# Patient Record
Sex: Male | Born: 1995 | Hispanic: No | Marital: Single | Smoking: Former smoker
Health system: Southern US, Community
[De-identification: ages and names within clinical notes are randomized; demographics above are authoritative.]

## PROBLEM LIST (undated history)

## (undated) DIAGNOSIS — J45909 Unspecified asthma, uncomplicated: Secondary | ICD-10-CM

---
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
## 2016-08-08 ENCOUNTER — Encounter (HOSPITAL_COMMUNITY): Payer: Self-pay | Admitting: Emergency Medicine

## 2016-08-08 ENCOUNTER — Emergency Department (HOSPITAL_COMMUNITY)
Admission: EM | Admit: 2016-08-08 | Discharge: 2016-08-08 | Disposition: A | Discharge status: Home / Self Care | Payer: Self-pay | Attending: Emergency Medicine | Admitting: Emergency Medicine

## 2016-08-08 DIAGNOSIS — Z87891 Personal history of nicotine dependence: Secondary | ICD-10-CM | POA: Insufficient documentation

## 2016-08-08 DIAGNOSIS — J4521 Mild intermittent asthma with (acute) exacerbation: Secondary | ICD-10-CM | POA: Insufficient documentation

## 2016-08-08 DIAGNOSIS — J039 Acute tonsillitis, unspecified: Secondary | ICD-10-CM | POA: Insufficient documentation

## 2016-08-08 LAB — MONONUCLEOSIS SCREEN: MONO SCREEN: NEGATIVE

## 2016-08-08 LAB — RAPID STREP SCREEN (MED CTR MEBANE ONLY): STREPTOCOCCUS, GROUP A SCREEN (DIRECT): NEGATIVE

## 2016-08-08 MED ORDER — PREDNISONE 20 MG PO TABS: ORAL_TABLET | ORAL | 0 refills | Status: DC

## 2016-08-08 MED ORDER — ALBUTEROL SULFATE (2.5 MG/3ML) 0.083% IN NEBU
5.0000 mg | INHALATION_SOLUTION | Freq: Once | RESPIRATORY_TRACT | Status: AC
  Administered 2016-08-08: 5 mg via RESPIRATORY_TRACT
  Filled 2016-08-08: qty 6

## 2016-08-08 MED ORDER — PENICILLIN V POTASSIUM 500 MG PO TABS: 500.0000 mg | ORAL_TABLET | Freq: Three times a day (TID) | ORAL | 0 refills | Status: AC

## 2016-08-08 MED ORDER — METHYLPREDNISOLONE SODIUM SUCC 125 MG IJ SOLR
125.0000 mg | Freq: Once | INTRAMUSCULAR | Status: AC
  Administered 2016-08-08: 125 mg via INTRAMUSCULAR
  Filled 2016-08-08: qty 2

## 2016-08-08 MED ORDER — PENICILLIN V POTASSIUM 500 MG PO TABS: 500.0000 mg | ORAL_TABLET | Freq: Three times a day (TID) | ORAL | 0 refills | Status: DC

## 2016-08-08 MED ORDER — ALBUTEROL SULFATE HFA 108 (90 BASE) MCG/ACT IN AERS
1.0000 | INHALATION_SPRAY | RESPIRATORY_TRACT | Status: DC | PRN
  Filled 2016-08-08: qty 6.7

## 2016-08-08 MED ORDER — ALBUTEROL (5 MG/ML) CONTINUOUS INHALATION SOLN
10.0000 mg/h | INHALATION_SOLUTION | RESPIRATORY_TRACT | Status: DC
  Administered 2016-08-08: 10 mg/h via RESPIRATORY_TRACT
  Filled 2016-08-08: qty 20

## 2016-08-08 NOTE — ED Provider Notes (Signed)
[facility]-EMERGENCY DEPT Provider Note   CSN: [id number] Arrival date & time: 08/08/16  1147   By signing my name below, I, Arianna Nassar, attest that this documentation has been prepared under the direction and in the presence of Loren Raceravid Sary Bogie, MD.  Electronically Signed: Octavia HeirArianna Nassar, ED Scribe. 08/08/16. 12:52 PM.   History   Chief Complaint Chief Complaint  Patient presents with  . Asthma    The history is provided by the patient. No language interpreter was used.   HPI Comments: Clelia SchaumannKane M Platter is a 20 y.o. male who has a PMhx of asthma presents to the Emergency Department presenting with a gradual worsening, moderate, asthma exacerbation x 1 month. Pt states having associated sore throat, fatigue, nonproductive cough, and burning sensation in his chest. He reports that everything "feels tight". He notes his symptoms are worse when he wakes up in the morning or lays flat. Pt has been using a nebulizer albuterol treatment 3-4x a day. Pt denies fever or chills.  Past Medical History:  Diagnosis Date  . Asthma     There are no active problems to display for this patient.   History reviewed. No pertinent surgical history.     Home Medications    Prior to Admission medications   Medication Sig Start Date End Date Taking? Authorizing Provider  penicillin v potassium (VEETID) 500 MG tablet Take 1 tablet (500 mg total) by mouth 3 (three) times daily. 08/08/16 08/15/16  Loren Raceravid Madelyn Tlatelpa, MD  predniSONE (DELTASONE) 20 MG tablet 3 tabs po day one, then 2 tabs daily x 4 days 08/08/16   Loren Raceravid Essie Lagunes, MD    Family History History reviewed. No pertinent family history.  Social History Social History  Substance Use Topics  . Smoking status: Former Smoker    Types: Cigarettes, Cigars  . Smokeless tobacco: Not on file  . Alcohol use No     Allergies   Patient has no known allergies.   Review of Systems Review of Systems  Constitutional: Positive for fatigue. Negative  for chills and fever.  HENT: Positive for congestion and sore throat. Negative for facial swelling and trouble swallowing.   Respiratory: Positive for cough, chest tightness, shortness of breath and wheezing.   Cardiovascular: Negative for chest pain, palpitations and leg swelling.  Gastrointestinal: Negative for abdominal pain, diarrhea, nausea and vomiting.  Musculoskeletal: Negative for back pain, myalgias, neck pain and neck stiffness.  Skin: Negative for rash.  Neurological: Negative for weakness, light-headedness and headaches.  All other systems reviewed and are negative.    Physical Exam Updated Vital Signs BP 146/67 (BP Location: Right Arm)   Pulse 118 Comment: albuterol treatment  Temp 98.9 F (37.2 C) (Oral)   Resp 18   SpO2 95%   Physical Exam  Constitutional: He is oriented to person, place, and time. He appears well-developed and well-nourished. No distress.  HENT:  Head: Normocephalic and atraumatic.  Mouth/Throat: Oropharyngeal exudate present.  Bilateral tonsillar hypertrophy with erythema and exudates.  Eyes: EOM are normal. Pupils are equal, round, and reactive to light.  Neck: Normal range of motion. Neck supple.  No meningismus  Cardiovascular: Normal rate and regular rhythm.   Pulmonary/Chest: Effort normal. He has wheezes.  Diffuse expiratory wheezing  Abdominal: Soft. Bowel sounds are normal. There is no tenderness. There is no rebound and no guarding.  Musculoskeletal: Normal range of motion. He exhibits no edema or tenderness.  No lower extremity swelling, asymmetry or tenderness.  Lymphadenopathy:    He has no  cervical adenopathy.  Neurological: He is alert and oriented to person, place, and time.  Moves all extremities without deficit. Sensation intact.  Skin: Skin is warm and dry. Capillary refill takes less than 2 seconds. No rash noted. He is not diaphoretic. No erythema.  Psychiatric: He has a normal mood and affect. His behavior is normal.    Nursing note and vitals reviewed.    ED Treatments / Results  DIAGNOSTIC STUDIES: Oxygen Saturation is 99% on RA, normal by my interpretation.  COORDINATION OF CARE:  12:51 PM Discussed treatment plan with pt at bedside and pt agreed to plan.  Labs (all labs ordered are listed, but only abnormal results are displayed) Labs Reviewed  RAPID STREP SCREEN (NOT AT [facility])  CULTURE, GROUP A STREP [facility])  MONONUCLEOSIS SCREEN    EKG  EKG Interpretation None       Radiology No results found.  Procedures Procedures (including critical care time)  Medications Ordered in ED Medications  methylPREDNISolone sodium succinate (SOLU-MEDROL) 125 mg/2 mL injection 125 mg (125 mg Intramuscular Given 08/08/16 1304)  albuterol (PROVENTIL) (2.5 MG/3ML) 0.083% nebulizer solution 5 mg (5 mg Nebulization Given 08/08/16 1313)     Initial Impression / Assessment and Plan / ED Course  I have reviewed the triage vital signs and the nursing notes.  Pertinent labs & imaging results that were available during my care of the patient were reviewed by me and considered in my medical decision making (see chart for details).  Clinical Course     I personally performed the services described in this documentation, which was scribed in my presence. The recorded information has been reviewed and is accurate.    Final Clinical Impressions(s) / ED Diagnoses   Final diagnoses:  Exacerbation of intermittent asthma, unspecified asthma severity  Tonsillitis with exudate  Patient is moving air much better. No further wheezing. Patient has bilateral tonsillar hypertrophy with exudates. Negative strep and mono.   New Prescriptions Discharge Medication List as of 08/08/2016  4:07 PM       Loren Raceravid Gavriela Cashin, MD 08/13/16 1630

## 2016-08-08 NOTE — ED Triage Notes (Signed)
Pt arrives via POv from home with asthma exacerbation for the last month. Pt reports cough and chest tightness over the last month. Denies recent fever. Pt with wheezing in all fields. VSS. NAD at present.

## 2016-08-10 LAB — CULTURE, GROUP A STREP (THRC)

## 2016-09-06 ENCOUNTER — Encounter (HOSPITAL_COMMUNITY): Payer: Self-pay | Admitting: Emergency Medicine

## 2016-09-06 ENCOUNTER — Emergency Department (HOSPITAL_COMMUNITY)
Admission: EM | Admit: 2016-09-06 | Discharge: 2016-09-06 | Disposition: A | Discharge status: Home / Self Care | Payer: Self-pay | Attending: Emergency Medicine | Admitting: Emergency Medicine

## 2016-09-06 DIAGNOSIS — J45901 Unspecified asthma with (acute) exacerbation: Secondary | ICD-10-CM | POA: Insufficient documentation

## 2016-09-06 DIAGNOSIS — Z87891 Personal history of nicotine dependence: Secondary | ICD-10-CM | POA: Insufficient documentation

## 2016-09-06 MED ORDER — ALBUTEROL SULFATE (2.5 MG/3ML) 0.083% IN NEBU
5.0000 mg | INHALATION_SOLUTION | Freq: Once | RESPIRATORY_TRACT | Status: AC
  Administered 2016-09-06: 5 mg via RESPIRATORY_TRACT

## 2016-09-06 MED ORDER — ALBUTEROL SULFATE (2.5 MG/3ML) 0.083% IN NEBU
INHALATION_SOLUTION | RESPIRATORY_TRACT | Status: AC
  Filled 2016-09-06: qty 6

## 2016-09-06 MED ORDER — ALBUTEROL SULFATE HFA 108 (90 BASE) MCG/ACT IN AERS
2.0000 | INHALATION_SPRAY | Freq: Once | RESPIRATORY_TRACT | Status: AC
  Administered 2016-09-06: 2 via RESPIRATORY_TRACT
  Filled 2016-09-06: qty 6.7

## 2016-09-06 MED ORDER — DEXAMETHASONE SODIUM PHOSPHATE 10 MG/ML IJ SOLN
10.0000 mg | Freq: Once | INTRAMUSCULAR | Status: AC
  Administered 2016-09-06: 10 mg via INTRAMUSCULAR
  Filled 2016-09-06: qty 1

## 2016-09-06 NOTE — ED Provider Notes (Signed)
[facility]-EMERGENCY DEPT Provider Note   CSN: [id number] Arrival date & time: 09/06/16  0134     History   Chief Complaint Chief Complaint  Patient presents with  . Asthma    HPI   Blood pressure 128/75, pulse 82, temperature 97.9 F (36.6 C), temperature source Oral, resp. rate 18, SpO2 97 %.  Joshua Roach is a 21 y.o. male complaining of shortness of breath and wheezing onset yesterday thinks this was likely set off by the cold weather. He has a history of asthma but he ran out of his inhaler one week ago. He has been hospitalized ever intubated for his asthma he denies fever, chills, productive cough, chest pain. Patient has received a nebulizer treatment in the waiting room and states that he feels much better.  Past Medical History:  Diagnosis Date  . Asthma     There are no active problems to display for this patient.   History reviewed. No pertinent surgical history.     Home Medications    Prior to Admission medications   Medication Sig Start Date End Date Taking? Authorizing Provider  predniSONE (DELTASONE) 20 MG tablet 3 tabs po day one, then 2 tabs daily x 4 days 08/08/16   Loren Raceravid Yelverton, MD    Family History No family history on file.  Social History Social History  Substance Use Topics  . Smoking status: Former Smoker    Types: Cigarettes, Cigars  . Smokeless tobacco: Never Used  . Alcohol use No     Allergies   Patient has no known allergies.   Review of Systems Review of Systems  10 systems reviewed and found to be negative, except as noted in the HPI.  Physical Exam Updated Vital Signs BP 128/75 (BP Location: Left Arm)   Pulse 82   Temp 97.9 F (36.6 C) (Oral)   Resp 18   SpO2 97%   Physical Exam  Constitutional: He is oriented to person, place, and time. He appears well-developed and well-nourished. No distress.  HENT:  Head: Normocephalic and atraumatic.  Mouth/Throat: Oropharynx is clear and moist.  Eyes: Conjunctivae and  EOM are normal. Pupils are equal, round, and reactive to light.  Neck: Normal range of motion.  Cardiovascular: Normal rate, regular rhythm and intact distal pulses.   Pulmonary/Chest: Effort normal and breath sounds normal. No respiratory distress. He has no wheezes. He has no rales. He exhibits no tenderness.  No tripoding, no stridor, patient speaking in complete sentences.   No wheezing, Rales or rhonchi.  Good air movement in all fields.   Abdominal: Soft. There is no tenderness.  Musculoskeletal: Normal range of motion.  Neurological: He is alert and oriented to person, place, and time.  Skin: He is not diaphoretic.  Psychiatric: He has a normal mood and affect.  Nursing note and vitals reviewed.    ED Treatments / Results  Labs (all labs ordered are listed, but only abnormal results are displayed) Labs Reviewed - No data to display  EKG  EKG Interpretation None       Radiology No results found.  Procedures Procedures (including critical care time)  Medications Ordered in ED Medications  albuterol (PROVENTIL) (2.5 MG/3ML) 0.083% nebulizer solution (  Canceled Entry 09/06/16 0242)  albuterol (PROVENTIL HFA;VENTOLIN HFA) 108 (90 Base) MCG/ACT inhaler 2 puff (not administered)  dexamethasone (DECADRON) injection 10 mg (not administered)  albuterol (PROVENTIL) (2.5 MG/3ML) 0.083% nebulizer solution 5 mg (5 mg Nebulization Given 09/06/16 0146)     Initial  Impression / Assessment and Plan / ED Course  I have reviewed the triage vital signs and the nursing notes.  Pertinent labs & imaging results that were available during my care of the patient were reviewed by me and considered in my medical decision making (see chart for details).  Clinical Course     Vitals:   09/06/16 0140 09/06/16 0409 09/06/16 0652  BP: 141/85 128/61 128/75  Pulse: 86 73 82  Resp: 19 19 18   Temp: 97.7 F (36.5 C) 97.7 F (36.5 C) 97.9 F (36.6 C)  TempSrc: Oral Oral Oral  SpO2: 99%  95% 97%    Medications  albuterol (PROVENTIL) (2.5 MG/3ML) 0.083% nebulizer solution (  Canceled Entry 09/06/16 0242)  albuterol (PROVENTIL HFA;VENTOLIN HFA) 108 (90 Base) MCG/ACT inhaler 2 puff (not administered)  dexamethasone (DECADRON) injection 10 mg (not administered)  albuterol (PROVENTIL) (2.5 MG/3ML) 0.083% nebulizer solution 5 mg (5 mg Nebulization Given 09/06/16 0146)    Joshua Roach is 21 y.o. male presenting with Asthma exacerbation, he ran out of his inhaler one week ago. Patient's lung sounds are clear with no wheezing and excellent air movement in all fields. Offered patient a second nebulizer and she declines. Lung sounds clear, saturating well on room air, no tachypnea or tachycardia, I doubt this is a pneumonia. Patient is given Decadron IM and an inhaler to go home with, resource guide provided.  Evaluation does not show pathology that would require ongoing emergent intervention or inpatient treatment. Pt is hemodynamically stable and mentating appropriately. Discussed findings and plan with patient/guardian, who agrees with care plan. All questions answered. Return precautions discussed and outpatient follow up given.      Final Clinical Impressions(s) / ED Diagnoses   Final diagnoses:  Exacerbation of asthma, unspecified asthma severity, unspecified whether persistent    New Prescriptions New Prescriptions   No medications on file     Wynetta Emery, PA-C 09/06/16 1324    Lavera Guise, MD 09/06/16 1714

## 2016-09-06 NOTE — Discharge Instructions (Signed)
Do not hesitate to return to the emergency room for any new, worsening or concerning symptoms. ° °Please obtain primary care using resource guide below. Let them know that you were seen in the emergency room and that they will need to obtain records for further outpatient management. ° ° °

## 2016-09-06 NOTE — ED Triage Notes (Signed)
Patient reports asthma attack yesterday with wheezing and productive cough , he ran out of his inhaler , denies fever or chills .

## 2016-09-29 ENCOUNTER — Encounter (HOSPITAL_COMMUNITY): Payer: Self-pay | Admitting: Family Medicine

## 2016-09-29 ENCOUNTER — Emergency Department (HOSPITAL_COMMUNITY)
Admission: EM | Admit: 2016-09-29 | Discharge: 2016-09-29 | Disposition: A | Discharge status: Home / Self Care | Payer: Self-pay | Attending: Emergency Medicine | Admitting: Emergency Medicine

## 2016-09-29 ENCOUNTER — Emergency Department (HOSPITAL_COMMUNITY): Payer: Self-pay

## 2016-09-29 DIAGNOSIS — J4520 Mild intermittent asthma, uncomplicated: Secondary | ICD-10-CM | POA: Insufficient documentation

## 2016-09-29 DIAGNOSIS — Z87891 Personal history of nicotine dependence: Secondary | ICD-10-CM | POA: Insufficient documentation

## 2016-09-29 MED ORDER — IPRATROPIUM-ALBUTEROL 0.5-2.5 (3) MG/3ML IN SOLN
3.0000 mL | Freq: Once | RESPIRATORY_TRACT | Status: AC
  Administered 2016-09-29: 3 mL via RESPIRATORY_TRACT

## 2016-09-29 MED ORDER — DEXAMETHASONE 4 MG PO TABS
10.0000 mg | ORAL_TABLET | Freq: Once | ORAL | Status: AC
  Administered 2016-09-29: 10 mg via ORAL
  Filled 2016-09-29: qty 3

## 2016-09-29 MED ORDER — ALBUTEROL SULFATE (2.5 MG/3ML) 0.083% IN NEBU: 5.0000 mg | INHALATION_SOLUTION | Freq: Once | RESPIRATORY_TRACT | Status: DC

## 2016-09-29 MED ORDER — ALBUTEROL SULFATE HFA 108 (90 BASE) MCG/ACT IN AERS
2.0000 | INHALATION_SPRAY | Freq: Once | RESPIRATORY_TRACT | Status: AC
  Administered 2016-09-29: 2 via RESPIRATORY_TRACT
  Filled 2016-09-29: qty 6.7

## 2016-09-29 MED ORDER — IPRATROPIUM-ALBUTEROL 0.5-2.5 (3) MG/3ML IN SOLN
RESPIRATORY_TRACT | Status: AC
  Filled 2016-09-29: qty 3

## 2016-09-29 NOTE — ED Provider Notes (Signed)
[facility] DEPT Provider Note   CSN: [id number] Arrival date & time: 09/29/16  1641  By signing my name below, I, Freida Busmaniana Omoyeni, attest that this documentation has been prepared under the direction and in the presence of Alvira MondayErin Stanlee Roehrig, MD . Electronically Signed: Freida Busmaniana Omoyeni, Scribe. 09/29/2016. 6:27 PM.  History   Chief Complaint Chief Complaint  Patient presents with  . Asthma   The history is provided by the patient. No language interpreter was used.     HPI Comments:  Joshua Roach is a 21 y.o. male with a history of asthma, who presents to the Emergency Department complaining of SOB with exertion x 2 days. He states his symptoms today are because he ran out of his albuterol inhaler which he uses mainly before exerting himself. He reports associated dry cough and mild sore throat due to cough. Symptoms today feel similar to past milder asthma exacerbations.  He denies CP, fever, and rhinorrhea. Reports they had stopped his other controller meds some time ago but has felt like his symptoms have been under control (with just albuterol) until he ran out of his albuterol.   Past Medical History:  Diagnosis Date  . Asthma     There are no active problems to display for this patient.   History reviewed. No pertinent surgical history.     Home Medications    Prior to Admission medications   Medication Sig Start Date End Date Taking? Authorizing Provider  predniSONE (DELTASONE) 20 MG tablet 3 tabs po day one, then 2 tabs daily x 4 days 08/08/16   Loren Raceravid Yelverton, MD    Family History No family history on file.  Social History Social History  Substance Use Topics  . Smoking status: Former Smoker    Types: Cigarettes, Cigars  . Smokeless tobacco: Never Used  . Alcohol use No     Allergies   Patient has no known allergies.   Review of Systems Review of Systems  Constitutional: Negative for fever.  HENT: Positive for sore throat. Negative for rhinorrhea.     Eyes: Negative for visual disturbance.  Respiratory: Positive for cough, shortness of breath and wheezing.   Cardiovascular: Negative for chest pain.  Gastrointestinal: Negative for abdominal pain.  Genitourinary: Negative for difficulty urinating.  Musculoskeletal: Negative for back pain and neck stiffness.  Skin: Negative for rash.  Neurological: Negative for syncope and headaches.  All other systems reviewed and are negative.    Physical Exam Updated Vital Signs BP 142/73 (BP Location: Left Arm)   Pulse 86   Temp 98.3 F (36.8 C) (Oral)   Resp 16   Ht 5\' 4"  (1.626 m)   Wt 210 lb (95.3 kg)   SpO2 98%   BMI 36.05 kg/m   Physical Exam  Constitutional: He is oriented to person, place, and time. He appears well-developed and well-nourished. No distress.  HENT:  Head: Normocephalic and atraumatic.  Eyes: Conjunctivae and EOM are normal.  Neck: Normal range of motion.  Cardiovascular: Normal rate, regular rhythm, normal heart sounds and intact distal pulses.  Exam reveals no gallop and no friction rub.   No murmur heard. Pulmonary/Chest: Effort normal and breath sounds normal. No respiratory distress. He has no wheezes. He has no rales.  Abdominal: Soft. He exhibits no distension. There is no tenderness. There is no guarding.  Musculoskeletal: He exhibits no edema.  Neurological: He is alert and oriented to person, place, and time.  Skin: Skin is warm and dry. He is not  diaphoretic.  Nursing note and vitals reviewed.    ED Treatments / Results  DIAGNOSTIC STUDIES:  Oxygen Saturation is 99% on RA, normal by my interpretation.    COORDINATION OF CARE:  6:26 PM Discussed treatment plan with pt at bedside and pt agreed to plan.  Labs (all labs ordered are listed, but only abnormal results are displayed) Labs Reviewed - No data to display  EKG  EKG Interpretation None       Radiology Dg Chest 2 View  Result Date: 09/29/2016 CLINICAL DATA:  Acute onset of  wheezing, sore throat and cough. Initial encounter. EXAM: CHEST  2 VIEW COMPARISON:  Chest radiograph performed 12/13/2015 FINDINGS: The lungs are well-aerated. Mild peribronchial thickening is noted. There is no evidence of focal opacification, pleural effusion or pneumothorax. The heart is normal in size; the mediastinal contour is within normal limits. No acute osseous abnormalities are seen. IMPRESSION: Mild peribronchial thickening noted.  Lungs otherwise clear. Electronically Signed   By: Roanna Raider M.D.   On: 09/29/2016 17:34    Procedures Procedures (including critical care time)  Medications Ordered in ED Medications  ipratropium-albuterol (DUONEB) 0.5-2.5 (3) MG/3ML nebulizer solution (not administered)  ipratropium-albuterol (DUONEB) 0.5-2.5 (3) MG/3ML nebulizer solution 3 mL (3 mLs Nebulization Given 09/29/16 1701)  dexamethasone (DECADRON) tablet 10 mg (10 mg Oral Given 09/29/16 1836)  albuterol (PROVENTIL HFA;VENTOLIN HFA) 108 (90 Base) MCG/ACT inhaler 2 puff (2 puffs Inhalation Given 09/29/16 1837)     Initial Impression / Assessment and Plan / ED Course  I have reviewed the triage vital signs and the nursing notes.  Pertinent labs & imaging results that were available during my care of the patient were reviewed by me and considered in my medical decision making (see chart for details).     21 year old male with a history of asthma presents with concern for increased wheezing and shortness of breath since he has been out of his albuterol inhaler. Chest x-ray shows no sign of pneumonia, no pneumothorax. Patient without tachypnea, no WOB, no significant wheezing on exam.  Doubt cardiac etiology, no chest pain. Reports symptoms similar to his exercise induced asthma.  Gave decadron however given no significant increase from baseline asthma do not feel he requires prednisone rx.  Given albuterol MDI. Recommend PCP follow up. Patient discharged in stable condition with understanding of  reasons to return.   Final Clinical Impressions(s) / ED Diagnoses   Final diagnoses:  Mild intermittent asthma, unspecified whether complicated    New Prescriptions Discharge Medication List as of 09/29/2016  6:30 PM     I personally performed the services described in this documentation, which was scribed in my presence. The recorded information has been reviewed and is accurate.     Alvira Monday, MD 09/29/16 [phone]

## 2016-09-29 NOTE — Discharge Instructions (Signed)
Take your inhaler 2 puffs as needed for wheezing or cough every 6 hours.

## 2016-09-29 NOTE — ED Triage Notes (Signed)
Pt presents via POV with c/o asthma exacerbation that began today - states has been out of albuterol inhaler since last night and was experiencing wheezing/shortness of breath today with exertion at work. Takes no other asthma medications. Denies pain, speaking in complete sentences.

## 2016-11-08 ENCOUNTER — Emergency Department (HOSPITAL_COMMUNITY): Payer: Self-pay

## 2016-11-08 ENCOUNTER — Emergency Department (HOSPITAL_COMMUNITY)
Admission: EM | Admit: 2016-11-08 | Discharge: 2016-11-08 | Disposition: A | Discharge status: Home / Self Care | Payer: Self-pay | Attending: Emergency Medicine | Admitting: Emergency Medicine

## 2016-11-08 ENCOUNTER — Encounter (HOSPITAL_COMMUNITY): Payer: Self-pay

## 2016-11-08 DIAGNOSIS — J45901 Unspecified asthma with (acute) exacerbation: Secondary | ICD-10-CM | POA: Insufficient documentation

## 2016-11-08 DIAGNOSIS — Z87891 Personal history of nicotine dependence: Secondary | ICD-10-CM | POA: Insufficient documentation

## 2016-11-08 MED ORDER — ALBUTEROL SULFATE (2.5 MG/3ML) 0.083% IN NEBU
5.0000 mg | INHALATION_SOLUTION | Freq: Once | RESPIRATORY_TRACT | Status: AC
  Administered 2016-11-08: 5 mg via RESPIRATORY_TRACT

## 2016-11-08 MED ORDER — ALBUTEROL SULFATE (2.5 MG/3ML) 0.083% IN NEBU
INHALATION_SOLUTION | RESPIRATORY_TRACT | Status: AC
  Filled 2016-11-08: qty 6

## 2016-11-08 MED ORDER — ALBUTEROL SULFATE HFA 108 (90 BASE) MCG/ACT IN AERS
2.0000 | INHALATION_SPRAY | Freq: Once | RESPIRATORY_TRACT | Status: AC
  Administered 2016-11-08: 2 via RESPIRATORY_TRACT
  Filled 2016-11-08: qty 6.7

## 2016-11-08 NOTE — Discharge Instructions (Signed)
Use your albuterol inhaler as prescribed as needed for wheezing and shortness of breath. Continue taking your home allergy medications as prescribed. Please follow up with a primary care provider from the Resource Guide provided below in 4-5 days for follow-up evaluation and further management of your asthma. Please return to the Emergency Department if symptoms worsen or new onset of fever, chest pain, difficulty breathing, coughing up blood, vomiting, unable to keep fluids down, syncope.

## 2016-11-08 NOTE — ED Notes (Signed)
Pt returned to room from xray.

## 2016-11-08 NOTE — ED Triage Notes (Signed)
Per Pt, Pt has a Hx of asthma. Reports having run out of his inhaler and noting wheezing that started yesterday. Reports having productive cough with yellow and clear sputum.

## 2016-11-08 NOTE — ED Provider Notes (Signed)
[facility] Provider Note   CSN: [id number] Arrival date & time: 11/08/16  7829     History   Chief Complaint Chief Complaint  Patient presents with  . Wheezing    HPI Joshua Roach is a 21 y.o. male.  HPI   Patient is a 21 year old male with history of asthma who presents the ED with complaint of wheezing. Patient reports yesterday he began having worsening asthma with associated nasal congestion, wheezing, cough, chest congestion and chest tightness. Patient reports running out of his home albuterol inhaler a few days ago. He notes his asthma got worse with the sudden change in weather over the past couple days. Denies fever, chills, headache, sore throat, hemoptysis, chest pain, abdominal pain, vomiting. Patient denies currently having a PCP but states he is in the process of getting a new one due to recently losing his health insurance. Denies any known sick contacts. Denies any recent hospitalizations.  Past Medical History:  Diagnosis Date  . Asthma     There are no active problems to display for this patient.   History reviewed. No pertinent surgical history.     Home Medications    Prior to Admission medications   Medication Sig Start Date End Date Taking? Authorizing Provider  predniSONE (DELTASONE) 20 MG tablet 3 tabs po day one, then 2 tabs daily x 4 days 08/08/16   Loren Racer, MD    Family History No family history on file.  Social History Social History  Substance Use Topics  . Smoking status: Former Smoker    Types: Cigarettes, Cigars  . Smokeless tobacco: Never Used  . Alcohol use No     Allergies   Patient has no known allergies.   Review of Systems Review of Systems  HENT: Positive for congestion.   Respiratory: Positive for cough, chest tightness, shortness of breath and wheezing.   All other systems reviewed and are negative.    Physical Exam Updated Vital Signs BP 142/78 (BP Location: Left Arm)   Pulse 75   Temp  98.4 F (36.9 C) (Oral)   Resp 18   Ht 5\' 4"  (1.626 m)   Wt 95.3 kg   SpO2 97%   BMI 36.05 kg/m   Physical Exam  Constitutional: He is oriented to person, place, and time. He appears well-developed and well-nourished.  HENT:  Head: Normocephalic and atraumatic.  Right Ear: Tympanic membrane normal.  Left Ear: Tympanic membrane normal.  Nose: Rhinorrhea present. Right sinus exhibits no maxillary sinus tenderness and no frontal sinus tenderness. Left sinus exhibits no maxillary sinus tenderness and no frontal sinus tenderness.  Mouth/Throat: Uvula is midline, oropharynx is clear and moist and mucous membranes are normal. No oropharyngeal exudate, posterior oropharyngeal edema, posterior oropharyngeal erythema or tonsillar abscesses. No tonsillar exudate.  Eyes: Conjunctivae and EOM are normal. Pupils are equal, round, and reactive to light. Right eye exhibits no discharge. Left eye exhibits no discharge. No scleral icterus.  Neck: Normal range of motion. Neck supple.  Cardiovascular: Normal rate, regular rhythm, normal heart sounds and intact distal pulses.   Pulmonary/Chest: Effort normal and breath sounds normal. No respiratory distress. He has no wheezes. He has no rales. He exhibits no tenderness.  Abdominal: Soft. Bowel sounds are normal. He exhibits no distension and no mass. There is no tenderness. There is no rebound and no guarding.  Musculoskeletal: Normal range of motion. He exhibits no edema.  Neurological: He is alert and oriented to person, place, and time.  Skin:  Skin is warm and dry.  Nursing note and vitals reviewed.    ED Treatments / Results  Labs (all labs ordered are listed, but only abnormal results are displayed) Labs Reviewed - No data to display  EKG  EKG Interpretation None       Radiology Dg Chest 2 View  Result Date: 11/08/2016 CLINICAL DATA:  21 year old with acute asthma exacerbation that began yesterday and continues today. EXAM: CHEST  2 VIEW  COMPARISON:  09/29/2016, 12/13/2015, 09/17/2004. FINDINGS: Suboptimal inspiration accounts for crowded bronchovascular markings, especially in the bases, and accentuates the cardiac silhouette. Taking this into account, cardiomediastinal silhouette unremarkable. Lungs clear. Bronchovascular markings normal. No pleural effusions. Visualized bony thorax intact. IMPRESSION: Suboptimal inspiration.  No acute cardiopulmonary disease. Electronically Signed   By: Hulan Saashomas  Lawrence M.D.   On: 11/08/2016 09:27    Procedures Procedures (including critical care time)  Medications Ordered in ED Medications  albuterol (PROVENTIL) (2.5 MG/3ML) 0.083% nebulizer solution (not administered)  albuterol (PROVENTIL HFA;VENTOLIN HFA) 108 (90 Base) MCG/ACT inhaler 2 puff (not administered)  albuterol (PROVENTIL) (2.5 MG/3ML) 0.083% nebulizer solution 5 mg (5 mg Nebulization Given 11/08/16 0848)     Initial Impression / Assessment and Plan / ED Course  I have reviewed the triage vital signs and the nursing notes.  Pertinent labs & imaging results that were available during my care of the patient were reviewed by me and considered in my medical decision making (see chart for details).     Patient Presents with asthma exacerbation and reports running out of his albuterol inhaler a few days ago. Patient reports significant improvement after albuterol neb given in the ED. VSS, no hypoxia noted. No current signs of respiratory distress. Exam after neb treatment revealed lungs clear bilaterally, no wheezing present. Pt states they are breathing at baseline. Pt has been instructed to continue using prescribed medications and to speak with PCP about today's exacerbation.  Discussed return precautions.  Final Clinical Impressions(s) / ED Diagnoses   Final diagnoses:  Mild asthma with exacerbation, unspecified whether persistent    New Prescriptions New Prescriptions   No medications on file     Barrett Henleicole Elizabeth  Senia Even, PA-C 11/08/16 0934    Melene Planan Floyd, DO 11/08/16 [id number]

## 2016-11-23 ENCOUNTER — Emergency Department (HOSPITAL_COMMUNITY): Payer: Self-pay

## 2016-11-23 ENCOUNTER — Encounter (HOSPITAL_COMMUNITY): Payer: Self-pay | Admitting: *Deleted

## 2016-11-23 ENCOUNTER — Emergency Department (HOSPITAL_COMMUNITY)
Admission: EM | Admit: 2016-11-23 | Discharge: 2016-11-23 | Disposition: A | Discharge status: Home / Self Care | Payer: Self-pay | Attending: Emergency Medicine | Admitting: Emergency Medicine

## 2016-11-23 DIAGNOSIS — J4520 Mild intermittent asthma, uncomplicated: Secondary | ICD-10-CM | POA: Insufficient documentation

## 2016-11-23 DIAGNOSIS — Z87891 Personal history of nicotine dependence: Secondary | ICD-10-CM | POA: Insufficient documentation

## 2016-11-23 MED ORDER — IPRATROPIUM-ALBUTEROL 0.5-2.5 (3) MG/3ML IN SOLN
3.0000 mL | Freq: Once | RESPIRATORY_TRACT | Status: AC
  Administered 2016-11-23: 3 mL via RESPIRATORY_TRACT
  Filled 2016-11-23: qty 3

## 2016-11-23 MED ORDER — BENZONATATE 100 MG PO CAPS: 100.0000 mg | ORAL_CAPSULE | Freq: Three times a day (TID) | ORAL | 0 refills | Status: DC

## 2016-11-23 MED ORDER — PREDNISONE 20 MG PO TABS
40.0000 mg | ORAL_TABLET | Freq: Every day | ORAL | 0 refills | Status: DC
Start: 2016-11-23 — End: 2017-01-19

## 2016-11-23 MED ORDER — ALBUTEROL SULFATE HFA 108 (90 BASE) MCG/ACT IN AERS: 1.0000 | INHALATION_SPRAY | Freq: Four times a day (QID) | RESPIRATORY_TRACT | 0 refills | Status: DC | PRN

## 2016-11-23 MED ORDER — PREDNISONE 20 MG PO TABS
60.0000 mg | ORAL_TABLET | Freq: Once | ORAL | Status: AC
  Administered 2016-11-23: 60 mg via ORAL
  Filled 2016-11-23: qty 3

## 2016-11-23 MED ORDER — ALBUTEROL SULFATE (2.5 MG/3ML) 0.083% IN NEBU: 2.5000 mg | INHALATION_SOLUTION | Freq: Four times a day (QID) | RESPIRATORY_TRACT | 12 refills | Status: DC | PRN

## 2016-11-23 NOTE — ED Triage Notes (Signed)
Pt reports hx of asthma. Having increase in chest tightness, sob, wheezing, cough x 2 days and is out of his inhaler. No resp distress noted at triage and spo2 97% on room air.

## 2016-11-23 NOTE — ED Notes (Signed)
Pt states  He has been wheezing more since pollen has increased and his  Nephews used his inhaler as a toy and he ran out

## 2016-11-23 NOTE — ED Provider Notes (Signed)
[facility] Provider Note   CSN: [id number] Arrival date & time: 11/23/16  4098   By signing my name below, I, Clovis Pu, attest that this documentation has been prepared under the direction and in the presence of  Demetrios Loll, PA-C. Electronically Signed: Clovis Pu, ED Scribe. 11/23/16. 9:29 AM.   History   Chief Complaint Chief Complaint  Patient presents with  . Asthma    HPI Comments:  Joshua Roach is a 21 y.o. male, with a PMHx of asthma, who presents to the Emergency Department complaining of acute onset SOB secondary to asthma x 2 days. Pt also reports wheezing, a cough and chest tightness. He notes triggers for his asthma exacerbations are allergies, cold weather and over exertion, smoke. He has run out of his inhaler since yesterday. States that his wheezing has been wore since pollen has increased. Pt denies any other associated symptoms. Pt states he recently stopped smoking.   The history is provided by the patient. No language interpreter was used.    Past Medical History:  Diagnosis Date  . Asthma     There are no active problems to display for this patient.   History reviewed. No pertinent surgical history.     Home Medications    Prior to Admission medications   Medication Sig Start Date End Date Taking? Authorizing Provider  predniSONE (DELTASONE) 20 MG tablet 3 tabs po day one, then 2 tabs daily x 4 days 08/08/16   Loren Racer, MD    Family History History reviewed. No pertinent family history.  Social History Social History  Substance Use Topics  . Smoking status: Former Smoker    Types: Cigarettes, Cigars  . Smokeless tobacco: Never Used  . Alcohol use No     Allergies   Patient has no known allergies.   Review of Systems Review of Systems  Constitutional: Negative for fever.  Respiratory: Positive for cough, chest tightness and shortness of breath.   All other systems reviewed and are negative.    Physical  Exam Updated Vital Signs BP (!) 142/87 (BP Location: Right Arm)   Pulse 65   Temp 98.4 F (36.9 C) (Oral)   Resp 17   Ht 5\' 4"  (1.626 m)   Wt 220 lb (99.8 kg)   SpO2 97%   BMI 37.76 kg/m   Physical Exam  Constitutional: He is oriented to person, place, and time. He appears well-developed and well-nourished. No distress.  HENT:  Head: Normocephalic and atraumatic.  Eyes: Conjunctivae are normal.  Neck: Normal range of motion. Neck supple.  Cardiovascular: Normal rate, regular rhythm, normal heart sounds and intact distal pulses.   Pulmonary/Chest: Effort normal. No accessory muscle usage. No tachypnea. No respiratory distress. He has no decreased breath sounds. He has wheezes. He has no rhonchi. He has no rales. He exhibits no tenderness.  Expiratory and inspiratory wheezes bilaterally. No crackles or course sounds noted. No hypoxia  Abdominal: He exhibits no distension.  Neurological: He is alert and oriented to person, place, and time.  Skin: Skin is warm and dry. Capillary refill takes less than 2 seconds.  Psychiatric: He has a normal mood and affect.  Nursing note and vitals reviewed.  ED Treatments / Results  DIAGNOSTIC STUDIES:  Oxygen Saturation is 97% on RA, normal by my interpretation.    COORDINATION OF CARE:  9:28 AM Discussed treatment plan with pt at bedside and pt agreed to plan.  Labs (all labs ordered are listed, but only abnormal  results are displayed) Labs Reviewed - No data to display  EKG  EKG Interpretation None       Radiology Dg Chest 2 View  Result Date: 11/23/2016 CLINICAL DATA:  History of asthma. Increasing chest tightness and shortness of breath. EXAM: CHEST  2 VIEW COMPARISON:  11/08/2016 FINDINGS: Heart and mediastinal contours are within normal limits. No focal opacities or effusions. No acute bony abnormality. IMPRESSION: No active cardiopulmonary disease. Electronically Signed   By: Charlett NoseKevin  Dover M.D.   On: 11/23/2016 09:09     Procedures Procedures (including critical care time)  Medications Ordered in ED Medications  predniSONE (DELTASONE) tablet 60 mg (not administered)  ipratropium-albuterol (DUONEB) 0.5-2.5 (3) MG/3ML nebulizer solution 3 mL (3 mLs Nebulization Given 11/23/16 0936)     Initial Impression / Assessment and Plan / ED Course  I have reviewed the triage vital signs and the nursing notes.  Pertinent labs & imaging results that were available during my care of the patient were reviewed by me and considered in my medical decision making (see chart for details).     Patient with mild signs and symptoms of asthma/RAD. Oxygen saturation is above 90%.Patient is not hypoxic. No tachypnea. No accessory muscle use, no cyanosis. Treated in the EDWith DuoNeb and prednisone..  Patient feels improved after treatment. Lung sounds were improved after treatment. Able to ambulate in the ED with sats above 90%. Will discharge with Tessalon, prednisone, albuterol. Encourage patient use over-the-counter allergy medications such as Zyrtec for allergies as this seems to be a trigger for patient. Pt instructed to follow up with PCP. Patient is hemodynamically stable. Discussed return precautions. Pt appears safe for discharge. All questions answered prior to discharge.    Final Clinical Impressions(s) / ED Diagnoses   Final diagnoses:  Mild intermittent asthma, unspecified whether complicated    New Prescriptions New Prescriptions   ALBUTEROL (PROVENTIL HFA;VENTOLIN HFA) 108 (90 BASE) MCG/ACT INHALER    Inhale 1-2 puffs into the lungs every 6 (six) hours as needed for wheezing or shortness of breath.   ALBUTEROL (PROVENTIL) (2.5 MG/3ML) 0.083% NEBULIZER SOLUTION    Take 3 mLs (2.5 mg total) by nebulization every 6 (six) hours as needed for wheezing or shortness of breath.   BENZONATATE (TESSALON) 100 MG CAPSULE    Take 1 capsule (100 mg total) by mouth every 8 (eight) hours.   PREDNISONE (DELTASONE) 20 MG  TABLET    Take 2 tablets (40 mg total) by mouth daily with breakfast.  I personally performed the services described in this documentation, which was scribed in my presence. The recorded information has been reviewed and is accurate.     Rise MuKenneth T Leaphart, PA-C 11/23/16 1018    Laurence Spatesachel Morgan Little, MD 11/23/16 [phone]

## 2016-11-23 NOTE — Discharge Instructions (Signed)
X-ray showed no signs of pneumonia. Use the albuterol inhaler as needed. Have given you a refill of your nebulizer solution. Use a Tessalon for cough. Take the prednisone starting tomorrow for 3 days. Make sure you get over-the-counter allergy medication such as Zyrtec take once a day for allergy season. Follow up with her primary care doctor. Return to ED if your symptoms worsen.

## 2017-01-19 ENCOUNTER — Emergency Department (HOSPITAL_COMMUNITY)
Admission: EM | Admit: 2017-01-19 | Discharge: 2017-01-19 | Disposition: A | Discharge status: Home / Self Care | Payer: Self-pay | Attending: Emergency Medicine | Admitting: Emergency Medicine

## 2017-01-19 ENCOUNTER — Encounter (HOSPITAL_COMMUNITY): Payer: Self-pay | Admitting: Nurse Practitioner

## 2017-01-19 DIAGNOSIS — Z87891 Personal history of nicotine dependence: Secondary | ICD-10-CM | POA: Insufficient documentation

## 2017-01-19 DIAGNOSIS — J4521 Mild intermittent asthma with (acute) exacerbation: Secondary | ICD-10-CM | POA: Insufficient documentation

## 2017-01-19 MED ORDER — PREDNISONE 20 MG PO TABS: ORAL_TABLET | ORAL | 0 refills | Status: DC

## 2017-01-19 MED ORDER — IPRATROPIUM-ALBUTEROL 0.5-2.5 (3) MG/3ML IN SOLN
3.0000 mL | RESPIRATORY_TRACT | Status: AC
  Administered 2017-01-19 (×3): 3 mL via RESPIRATORY_TRACT
  Filled 2017-01-19: qty 6
  Filled 2017-01-19: qty 3

## 2017-01-19 MED ORDER — PREDNISONE 20 MG PO TABS
60.0000 mg | ORAL_TABLET | Freq: Once | ORAL | Status: AC
  Administered 2017-01-19: 60 mg via ORAL
  Filled 2017-01-19: qty 3

## 2017-01-19 NOTE — ED Provider Notes (Signed)
[facility]-EMERGENCY DEPT Provider Note   CSN: [id number] Arrival date & time: 01/19/17  1615  By signing my name below, I, Thelma Bargeick Cochran, attest that this documentation has been prepared under the direction and in the presence of Melene PlanFloyd, Mali Eppard, DO. Electronically Signed: Thelma BargeNick Cochran, Scribe. 01/19/17. 6:02 PM.  History   Chief Complaint Chief Complaint  Patient presents with  . Asthma   The history is provided by the patient. No language interpreter was used.  Asthma  This is a chronic problem. Associated symptoms include shortness of breath. Pertinent negatives include no chest pain, no abdominal pain and no headaches.    HPI Comments: Clelia SchaumannKane M Knights is a 21 y.o. male with a PMHx of asthma who presents to the Emergency Department complaining of constant  asthma exacerbation since yesterday. He states he was walking around outside and might have been exposed to environmental allergens. He has associated congestion, tightness in his chest, SOB, and productive cough that is yellow in color. He has tried to use his inhaler with only mild, short-lived relief. He denies ear pain and fever.  Past Medical History:  Diagnosis Date  . Asthma     There are no active problems to display for this patient.   History reviewed. No pertinent surgical history.     Home Medications    Prior to Admission medications   Medication Sig Start Date End Date Taking? Authorizing Provider  albuterol (PROVENTIL HFA;VENTOLIN HFA) 108 (90 Base) MCG/ACT inhaler Inhale 1-2 puffs into the lungs every 6 (six) hours as needed for wheezing or shortness of breath. 11/23/16   Leaphart, Iantha FallenKenneth T, PA-C  albuterol (PROVENTIL) (2.5 MG/3ML) 0.083% nebulizer solution Take 3 mLs (2.5 mg total) by nebulization every 6 (six) hours as needed for wheezing or shortness of breath. 11/23/16   Leaphart, Lynann BeaverKenneth T, PA-C  benzonatate (TESSALON) 100 MG capsule Take 1 capsule (100 mg total) by mouth every 8 (eight) hours. 11/23/16   Rise MuLeaphart,  Kenneth T, PA-C  predniSONE (DELTASONE) 20 MG tablet 2 tabs po daily x 4 days 01/19/17   Melene PlanFloyd, Umaiza Matusik, DO    Family History History reviewed. No pertinent family history.  Social History Social History  Substance Use Topics  . Smoking status: Former Smoker    Types: Cigarettes, Cigars  . Smokeless tobacco: Never Used  . Alcohol use No     Allergies   Patient has no known allergies.   Review of Systems Review of Systems  Constitutional: Negative for chills and fever.  HENT: Positive for congestion. Negative for facial swelling.   Eyes: Negative for discharge and visual disturbance.  Respiratory: Positive for cough (productive, yellow), chest tightness and shortness of breath.   Cardiovascular: Negative for chest pain and palpitations.  Gastrointestinal: Negative for abdominal pain, diarrhea and vomiting.  Musculoskeletal: Negative for arthralgias and myalgias.  Skin: Negative for color change and rash.  Allergic/Immunologic: Positive for environmental allergies.  Neurological: Negative for tremors, syncope and headaches.  Psychiatric/Behavioral: Negative for confusion and dysphoric mood.     Physical Exam Updated Vital Signs BP (!) 139/97   Pulse 88   Temp 98.5 F (36.9 C) (Oral)   Resp 18   SpO2 97%   Physical Exam  Constitutional: He is oriented to person, place, and time. He appears well-developed and well-nourished.  HENT:  Head: Normocephalic and atraumatic.  Eyes: EOM are normal. Pupils are equal, round, and reactive to light.  Neck: Normal range of motion. Neck supple. No JVD present.  Cardiovascular: Normal rate  and regular rhythm.  Exam reveals no gallop and no friction rub.   No murmur heard. Pulmonary/Chest: No respiratory distress. He has no wheezes.  Diminished breath sounds in all level fields Prolonged expirations Faint wheezing diffusing  Abdominal: He exhibits no distension. There is no rebound and no guarding.  Musculoskeletal: Normal range of  motion.  Neurological: He is alert and oriented to person, place, and time.  Skin: No rash noted. No pallor.  Psychiatric: He has a normal mood and affect. His behavior is normal.  Nursing note and vitals reviewed.    ED Treatments / Results  DIAGNOSTIC STUDIES: Oxygen Saturation is 97% on RA, normal by my interpretation.    COORDINATION OF CARE: 5:13 PM Discussed treatment plan with pt at bedside and pt agreed to plan.  Labs (all labs ordered are listed, but only abnormal results are displayed) Labs Reviewed - No data to display  EKG  EKG Interpretation  Date/Time:  Thursday Jan 19 2017 16:44:19 EDT Ventricular Rate:  85 PR Interval:  132 QRS Duration: 92 QT Interval:  358 QTC Calculation: 426 R Axis:   97 Text Interpretation:  Normal sinus rhythm Rightward axis T wave abnormality, consider inferior ischemia Abnormal ECG No old tracing to compare Confirmed by Jeanifer Halliday MD, DANIEL [phone]) on 01/19/2017 4:48:05 PM       Radiology No results found.  Procedures Procedures (including critical care time)  Medications Ordered in ED Medications  ipratropium-albuterol (DUONEB) 0.5-2.5 (3) MG/3ML nebulizer solution 3 mL (3 mLs Nebulization Given 01/19/17 1728)  predniSONE (DELTASONE) tablet 60 mg (60 mg Oral Given 01/19/17 1721)     Initial Impression / Assessment and Plan / ED Course  I have reviewed the triage vital signs and the nursing notes.  Pertinent labs & imaging results that were available during my care of the patient were reviewed by me and considered in my medical decision making (see chart for details).     21 yo M with a chief complaint of URI like symptoms. Going on for the past week or so. Using his inhaler at home with minimal relief. Denies any fevers. On exam patient with diffuse wheezes. He was given 3 DuoNeb's and prednisone with significant improvement. Discharge home.  6:02 PM:  I have discussed the diagnosis/risks/treatment options with the patient and  family and believe the pt to be eligible for discharge home to follow-up with PCP. We also discussed returning to the ED immediately if new or worsening sx occur. We discussed the sx which are most concerning (e.g., sudden worsening pain, fever, inability to tolerate by mouth) that necessitate immediate return. Medications administered to the patient during their visit and any new prescriptions provided to the patient are listed below.  Medications given during this visit Medications  ipratropium-albuterol (DUONEB) 0.5-2.5 (3) MG/3ML nebulizer solution 3 mL (3 mLs Nebulization Given 01/19/17 1728)  predniSONE (DELTASONE) tablet 60 mg (60 mg Oral Given 01/19/17 1721)     The patient appears reasonably screen and/or stabilized for discharge and I doubt any other medical condition or other [facility] requiring further screening, evaluation, or treatment in the ED at this time prior to discharge.    Final Clinical Impressions(s) / ED Diagnoses   Final diagnoses:  Mild intermittent asthma with exacerbation    New Prescriptions New Prescriptions   PREDNISONE (DELTASONE) 20 MG TABLET    2 tabs po daily x 4 days   I personally performed the services described in this documentation, which was scribed in my presence.  The recorded information has been reviewed and is accurate.     Melene Plan, DO 01/19/17 Flossie Buffy

## 2017-01-19 NOTE — Discharge Instructions (Signed)
Use your inhaler every 4 hours(6 puffs) while awake, return for sudden worsening shortness of breath, or if you need to use your inhaler more often.  ° °

## 2017-01-19 NOTE — ED Triage Notes (Signed)
Pt presents with c/o asthma exacerbation. His symptoms began yesterday after taking a long walk outside. He reports nasal and chest congestion, productive Cough with yellow sputum, wheezing, SOB. He denies any fevers. He has been using his albuterol inhaler with no improvement.

## 2017-05-15 ENCOUNTER — Emergency Department (HOSPITAL_COMMUNITY)
Admission: EM | Admit: 2017-05-15 | Discharge: 2017-05-15 | Disposition: A | Discharge status: Home / Self Care | Payer: Self-pay | Attending: Emergency Medicine | Admitting: Emergency Medicine

## 2017-05-15 ENCOUNTER — Encounter (HOSPITAL_COMMUNITY): Payer: Self-pay

## 2017-05-15 ENCOUNTER — Emergency Department (HOSPITAL_COMMUNITY): Payer: Self-pay

## 2017-05-15 DIAGNOSIS — J4521 Mild intermittent asthma with (acute) exacerbation: Secondary | ICD-10-CM | POA: Insufficient documentation

## 2017-05-15 DIAGNOSIS — Z79899 Other long term (current) drug therapy: Secondary | ICD-10-CM | POA: Insufficient documentation

## 2017-05-15 DIAGNOSIS — Z87891 Personal history of nicotine dependence: Secondary | ICD-10-CM | POA: Insufficient documentation

## 2017-05-15 LAB — GLUCOSE, CAPILLARY: GLUCOSE-CAPILLARY: 94 mg/dL (ref 65–99)

## 2017-05-15 MED ORDER — CETIRIZINE HCL 10 MG PO TABS: 10.0000 mg | ORAL_TABLET | Freq: Every day | ORAL | 0 refills | Status: DC

## 2017-05-15 MED ORDER — ALBUTEROL SULFATE (2.5 MG/3ML) 0.083% IN NEBU
5.0000 mg | INHALATION_SOLUTION | Freq: Once | RESPIRATORY_TRACT | Status: AC
  Administered 2017-05-15: 5 mg via RESPIRATORY_TRACT
  Filled 2017-05-15: qty 6

## 2017-05-15 MED ORDER — ALBUTEROL SULFATE HFA 108 (90 BASE) MCG/ACT IN AERS: 1.0000 | INHALATION_SPRAY | Freq: Four times a day (QID) | RESPIRATORY_TRACT | 0 refills | Status: DC | PRN

## 2017-05-15 MED ORDER — FLUTICASONE PROPIONATE 50 MCG/ACT NA SUSP: 2.0000 | Freq: Every day | NASAL | 0 refills | Status: DC

## 2017-05-15 MED ORDER — IPRATROPIUM-ALBUTEROL 0.5-2.5 (3) MG/3ML IN SOLN
3.0000 mL | Freq: Once | RESPIRATORY_TRACT | Status: AC
  Administered 2017-05-15: 3 mL via RESPIRATORY_TRACT
  Filled 2017-05-15: qty 3

## 2017-05-15 MED ORDER — DEXAMETHASONE 4 MG PO TABS
10.0000 mg | ORAL_TABLET | Freq: Once | ORAL | Status: AC
  Administered 2017-05-15: 23:00:00 10 mg via ORAL
  Filled 2017-05-15: qty 2

## 2017-05-15 NOTE — Discharge Instructions (Signed)
You were seen here today for symptoms related to your known Asthma.   Please follow-up with your doctor in regards to your asthma exacerbation in the next 3 days for further evaluation and management of your asthma. Read the instructions below to learn more about factors that can trigger asthma. Use your albuterol inhaler as directed. Your more than welcome to return to the emergency department if you become short of breath, your albuterol inhaler does not relieve symptoms, you are having chest pain associated with difficulty breathing, or any symptoms that are concerning to you.    IDENTIFY AND CONTROL FACTORS THAT MAKE YOUR ASTHMA WORSE A number of common things can set off or make your asthma symptoms worse (asthma triggers). Keep track of your asthma symptoms for several weeks, detailing all the environmental and emotional factors that are linked with your asthma. When you have an asthma attack, go back to your asthma diary to see which factor, or combination of factors, might have contributed to it. Once you know what these factors are, you can take steps to control many of them.  Allergies: If you have allergies and asthma, it is important to take asthma prevention steps at home. Asthma attacks (worsening of asthma symptoms) can be triggered by allergies, which can cause temporary increased inflammation of your airways. Minimizing contact with the substance to which you are allergic will help prevent an asthma attack. Animal Dander:  Some people are allergic to the flakes of skin or dried saliva from animals with fur or feathers. Keep these pets out of your home.  If you can't keep a pet outdoors, keep the pet out of your bedroom and other sleeping areas at all times, and keep the door closed.  Remove carpets and furniture covered with cloth from your home. If that is not possible, keep the pet away from fabric-covered furniture and carpets.  Dust Mites: Many people with asthma are allergic to  dust mites. Dust mites are tiny bugs that are found in every home, in mattresses, pillows, carpets, fabric-covered furniture, bedcovers, clothes, stuffed toys, fabric, and other fabric-covered items.  Cover your mattress in a special dust-proof cover.  Cover your pillow in a special dust-proof cover, or wash the pillow each week in hot water. Water must be hotter than 130 F to kill dust mites. Cold or warm water used with detergent and bleach can also be effective.  Wash the sheets and blankets on your bed each week in hot water.  Try not to sleep or lie on cloth-covered cushions.  Call ahead when traveling and ask for a smoke-free hotel room. Bring your own bedding and pillows, in case the hotel only supplies feather pillows and down comforters, which may contain dust mites and cause asthma symptoms.  Remove carpets from your bedroom and those laid on concrete, if you can.  Keep stuffed toys out of the bed, or wash the toys weekly in hot water or cooler water with detergent and bleach.  Cockroaches: Many people with asthma are allergic to the droppings and remains of cockroaches.  Keep food and garbage in closed containers. Never leave food out.  Use poison baits, traps, powders, gels, or paste (for example, boric acid).  If a spray is used to kill cockroaches, stay out of the room until the odor goes away.  Indoor Mold: Fix leaky faucets, pipes, or other sources of water that have mold around them.  Clean moldy surfaces with a cleaner that has bleach in it.  Pollen and Outdoor Mold: When pollen or mold spore counts are high, try to keep your windows closed.  Stay indoors with windows closed from late morning to afternoon, if you can. Pollen and some mold spore counts are highest at that time.  Ask your caregiver whether you need to take or increase anti-inflammatory medicine before your allergy season starts.  Irritants:  Tobacco smoke is an irritant. If you smoke, ask your caregiver how you  can quit. Ask family members to quit smoking, too. Do not allow smoking in your home or car.  If possible, do not use a wood-burning stove, kerosene heater, or fireplace. Minimize exposure to all sources of smoke, including incense, candles, fires, and fireworks.  Try to stay away from strong odors and sprays, such as perfume, talcum powder, hair spray, and paints.  Decrease humidity in your home and use an indoor air cleaning device. Reduce indoor humidity to below 60 percent. Dehumidifiers or central air conditioners can do this.  Try to have someone else vacuum for you once or twice a week, if you can. Stay out of rooms while they are being vacuumed and for a short while afterward.  If you vacuum, use a dust mask from a hardware store, a double-layered or microfilter vacuum cleaner bag, or a vacuum cleaner with a HEPA filter.  Sulfites in foods and beverages can be irritants. Do not drink beer or wine, or eat dried fruit, processed potatoes, or shrimp if they cause asthma symptoms.  Cold air can trigger an asthma attack. Cover your nose and mouth with a scarf on cold or windy days.  Several health conditions can make asthma more difficult to manage, including runny nose, sinus infections, reflux disease, psychological stress, and sleep apnea. Your caregiver will treat these conditions, as well.  Avoid close contact with people who have a cold or the flu, since your asthma symptoms may get worse if you catch the infection from them. Wash your hands thoroughly after touching items that may have been handled by people with a respiratory infection.  Get a flu shot every year to protect against the flu virus, which often makes asthma worse for days or weeks. Also get a pneumonia shot once every five to 10 years.  Drugs: Aspirin and other painkillers can cause asthma attacks. 10% to 20% of people with asthma have sensitivity to aspirin or a group of painkillers called non-steroidal anti-inflammatory drugs  (NSAIDS), such as ibuprofen and naproxen. These drugs are used to treat pain and reduce fevers. Asthma attacks caused by any of these medicines can be severe and even fatal. These drugs must be avoided in people who have known aspirin sensitive asthma. Products with acetaminophen are considered safe for people who have asthma. It is important that people with aspirin sensitivity read labels of all over-the-counter drugs used to treat pain, colds, coughs, and fever.  Beta blockers and ACE inhibitors are other drugs which you should discuss with your caregiver, in relation to your asthma.   EXERCISE  If you have exercise-induced asthma, or are planning vigorous exercise, or exercise in cold, humid, or dry environments, prevent exercise-induced asthma by following your caregiver's advice regarding asthma treatment before exercising. Additional Information:  Your vital signs today were: BP (!) 147/82 (BP Location: Left Arm)    Pulse 100    Temp 98.4 F (36.9 C) (Oral)    Resp 20    Ht  (1.626 m)    Wt 99.8 kg (220 lb)  SpO2 95%    BMI 37.76 kg/m  If your blood pressure (BP) was elevated above 135/85 this visit, please have this repeated by your doctor within one month. ---------------

## 2017-05-15 NOTE — ED Provider Notes (Signed)
[facility]-EMERGENCY DEPT Provider Note   CSN: [id number] Arrival date & time: 05/15/17  1837     History   Chief Complaint Chief Complaint  Patient presents with  . Shortness of Breath  . Cough    HPI Joshua Roach is a 21 y.o. male with a history of asthma who presents to the emergency department today for cough and wheezing. Since this morning the patient has developed a nonproductive cough, shortness of breath and wheeze. He states that this is not relieved with his albuterol inhaler or pseudoephedrine. He states that he has mild chest tightness due to the difficulty breathing since onset. He says this is like a normal flare of his asthma only not relieved by his inhaler. The patient says he used to be on daily allergy and prophylactic asthma medication but has been off them since losing his insurance. He says he has not followed up by an allergy specialist and does not have a PCP at this time. He notes he uses his inhaler 2-3x/day. Denies history of intubations, or hospitalizations for Asthma. No recent illness. No fever, recent surgery or travel, trauma, immobilization, smoking, previous blood clot, cough, hemoptysis, cancer, lower extremity pain or swelling. Patient is not on home O2. Patient is a former smoker.   HPI  Past Medical History:  Diagnosis Date  . Asthma     There are no active problems to display for this patient.   History reviewed. No pertinent surgical history.     Home Medications    Prior to Admission medications   Medication Sig Start Date End Date Taking? Authorizing Provider  albuterol (PROVENTIL HFA;VENTOLIN HFA) 108 (90 Base) MCG/ACT inhaler Inhale 1-2 puffs into the lungs every 6 (six) hours as needed for wheezing or shortness of breath. 11/23/16   Leaphart, Iantha Fallen T, PA-C  albuterol (PROVENTIL) (2.5 MG/3ML) 0.083% nebulizer solution Take 3 mLs (2.5 mg total) by nebulization every 6 (six) hours as needed for wheezing or shortness of breath. 11/23/16    Leaphart, Lynann Beaver, PA-C  benzonatate (TESSALON) 100 MG capsule Take 1 capsule (100 mg total) by mouth every 8 (eight) hours. 11/23/16   Rise Mu, PA-C  predniSONE (DELTASONE) 20 MG tablet 2 tabs po daily x 4 days 01/19/17   Melene Plan, DO    Family History History reviewed. No pertinent family history.  Social History Social History  Substance Use Topics  . Smoking status: Former Smoker    Types: Cigarettes, Cigars  . Smokeless tobacco: Never Used  . Alcohol use No     Allergies   Patient has no known allergies.   Review of Systems Review of Systems  All other systems reviewed and are negative.    Physical Exam Updated Vital Signs BP (!) 147/82 (BP Location: Left Arm)   Pulse 100   Temp 98.4 F (36.9 C) (Oral)   Resp 20   Ht  (1.626 m)   Wt 99.8 kg (220 lb)   SpO2 95%   BMI 37.76 kg/m   Physical Exam  Constitutional: He appears well-developed and well-nourished.  HENT:  Head: Normocephalic and atraumatic.  Right Ear: Hearing, tympanic membrane, external ear and ear canal normal.  Left Ear: Hearing, tympanic membrane, external ear and ear canal normal.  Nose: Mucosal edema present.  Mouth/Throat: Uvula is midline, oropharynx is clear and moist and mucous membranes are normal. No tonsillar exudate.  Eyes: Pupils are equal, round, and reactive to light. Right eye exhibits no discharge. Left eye exhibits  no discharge. No scleral icterus.  Pupils bilaterally dilated but PERRLA  Neck: Trachea normal. Neck supple. No spinous process tenderness present. No neck rigidity. Normal range of motion present.  Cardiovascular: Normal rate, regular rhythm and intact distal pulses.   No murmur heard. Pulses:      Radial pulses are 2+ on the right side, and 2+ on the left side.       Dorsalis pedis pulses are 2+ on the right side, and 2+ on the left side.       Posterior tibial pulses are 2+ on the right side, and 2+ on the left side.  No lower extremity  swelling or edema. Calves symmetric in size bilaterally.  Pulmonary/Chest: Effort normal. No respiratory distress. He has wheezes. He exhibits no tenderness.  No tripoding, pursed lip breathing or accessory muscle use  Abdominal: Soft. Bowel sounds are normal. There is no tenderness. There is no rebound and no guarding.  Musculoskeletal: He exhibits no edema.  Lymphadenopathy:    He has no cervical adenopathy.  Neurological: He is alert.  Skin: Skin is warm and dry. No rash noted. He is not diaphoretic.  Psychiatric: He has a normal mood and affect.  Nursing note and vitals reviewed.    ED Treatments / Results  Labs (all labs ordered are listed, but only abnormal results are displayed) Labs Reviewed  CBG MONITORING, ED    EKG  EKG Interpretation None       Radiology Dg Chest 2 View  Result Date: 05/15/2017 CLINICAL DATA:  History put asthma with sharp chest pain and difficulty breathing EXAM: CHEST  2 VIEW COMPARISON:  November 23, 2016 FINDINGS: The heart size and mediastinal contours are within normal limits. There is no focal infiltrate, pulmonary edema, or pleural effusion. The visualized skeletal structures are unremarkable. IMPRESSION: No active cardiopulmonary disease. Electronically Signed   By: Sherian Rein M.D.   On: 05/15/2017 20:13    Procedures Procedures (including critical care time)  Medications Ordered in ED Medications  ipratropium-albuterol (DUONEB) 0.5-2.5 (3) MG/3ML nebulizer solution 3 mL (not administered)  albuterol (PROVENTIL) (2.5 MG/3ML) 0.083% nebulizer solution 5 mg (5 mg Nebulization Given 05/15/17 1909)     Initial Impression / Assessment and Plan / ED Course  I have reviewed the triage vital signs and the nursing notes.  Pertinent labs & imaging results that were available during my care of the patient were reviewed by me and considered in my medical decision making (see chart for details).     21 year old male with history of asthma  presenting with asthma excerebration. Patient is with wheezing on exam. He is sating at 95% on RA without pursed lip breathing, accessory muscle use, or other signs of respiratory distress. His CXR is without signs of infection or other cardiopulmonary disease. Douneb given that improved the patient breathing to what he feels is back to baseline. I checked the patient sugar that did not indicate the patient was a diabetic, so I gave a  decadron given in the department. Pt able to ambulate without SOB. States still breathing at baseline. Pt has been instructed to continue using prescribed medications (will start on allergy medication) and to speak with PCP about today's exacerbation. Advised the patient to return for any worsening SOB or concerning symptoms.    Final Clinical Impressions(s) / ED Diagnoses   Final diagnoses:  Mild intermittent asthma with exacerbation    New Prescriptions New Prescriptions   No medications on file  Jacinto Halim, PA-C 05/15/17 2243    Nira Conn, MD 05/16/17 [phone]

## 2017-05-15 NOTE — ED Triage Notes (Signed)
Patient c/o SOB and a non productive cough since this AM. Patient denies any fever.

## 2017-09-22 ENCOUNTER — Emergency Department (HOSPITAL_COMMUNITY)
Admission: EM | Admit: 2017-09-22 | Discharge: 2017-09-22 | Disposition: A | Discharge status: Home / Self Care | Payer: Self-pay | Attending: Emergency Medicine | Admitting: Emergency Medicine

## 2017-09-22 ENCOUNTER — Encounter (HOSPITAL_COMMUNITY): Payer: Self-pay | Admitting: Emergency Medicine

## 2017-09-22 ENCOUNTER — Emergency Department (HOSPITAL_COMMUNITY): Payer: Self-pay

## 2017-09-22 ENCOUNTER — Other Ambulatory Visit: Payer: Self-pay

## 2017-09-22 DIAGNOSIS — J4521 Mild intermittent asthma with (acute) exacerbation: Secondary | ICD-10-CM | POA: Insufficient documentation

## 2017-09-22 DIAGNOSIS — Z79899 Other long term (current) drug therapy: Secondary | ICD-10-CM | POA: Insufficient documentation

## 2017-09-22 DIAGNOSIS — Z87891 Personal history of nicotine dependence: Secondary | ICD-10-CM | POA: Insufficient documentation

## 2017-09-22 LAB — I-STAT TROPONIN, ED: TROPONIN I, POC: 0 ng/mL (ref 0.00–0.08)

## 2017-09-22 LAB — BASIC METABOLIC PANEL
Anion gap: 12 (ref 5–15)
BUN: 10 mg/dL (ref 6–20)
CALCIUM: 9.2 mg/dL (ref 8.9–10.3)
CHLORIDE: 106 mmol/L (ref 101–111)
CO2: 23 mmol/L (ref 22–32)
CREATININE: 0.84 mg/dL (ref 0.61–1.24)
GFR calc Af Amer: >60 mL/min (ref >60)
GFR calc non Af Amer: >60 mL/min (ref >60)
Glucose, Bld: 101 mg/dL — ABNORMAL HIGH (ref 65–99)
Potassium: 3.5 mmol/L (ref 3.5–5.1)
Sodium: 141 mmol/L (ref 135–145)

## 2017-09-22 LAB — CBC
HCT: 44.9 % (ref 39.0–52.0)
Hemoglobin: 15.6 g/dL (ref 13.0–17.0)
MCH: 29.7 pg (ref 26.0–34.0)
MCHC: 34.7 g/dL (ref 30.0–36.0)
MCV: 85.4 fL (ref 78.0–100.0)
PLATELETS: 300 10*3/uL (ref 150–400)
RBC: 5.26 MIL/uL (ref 4.22–5.81)
RDW: 12.1 % (ref 11.5–15.5)
WBC: 8.6 10*3/uL (ref 4.0–10.5)

## 2017-09-22 MED ORDER — PREDNISONE 20 MG PO TABS: 60.0000 mg | ORAL_TABLET | Freq: Every day | ORAL | 0 refills | Status: AC

## 2017-09-22 MED ORDER — ALBUTEROL SULFATE HFA 108 (90 BASE) MCG/ACT IN AERS
4.0000 | INHALATION_SPRAY | Freq: Once | RESPIRATORY_TRACT | Status: AC
  Administered 2017-09-22: 4 via RESPIRATORY_TRACT
  Filled 2017-09-22: qty 6.7

## 2017-09-22 MED ORDER — ALBUTEROL SULFATE (2.5 MG/3ML) 0.083% IN NEBU
INHALATION_SOLUTION | RESPIRATORY_TRACT | Status: AC
  Filled 2017-09-22: qty 6

## 2017-09-22 MED ORDER — ALBUTEROL SULFATE (2.5 MG/3ML) 0.083% IN NEBU: 2.5000 mg | INHALATION_SOLUTION | RESPIRATORY_TRACT | 0 refills | Status: DC | PRN

## 2017-09-22 MED ORDER — AEROCHAMBER PLUS FLO-VU LARGE MISC
1.0000 | Freq: Once | Status: AC
  Administered 2017-09-22: 1
  Filled 2017-09-22 (×2): qty 1

## 2017-09-22 MED ORDER — PREDNISONE 20 MG PO TABS
60.0000 mg | ORAL_TABLET | Freq: Once | ORAL | Status: AC
  Administered 2017-09-22: 60 mg via ORAL
  Filled 2017-09-22: qty 3

## 2017-09-22 NOTE — ED Notes (Signed)
ED Provider at bedside. 

## 2017-09-22 NOTE — ED Provider Notes (Signed)
[facility] EMERGENCY DEPARTMENT Provider Note   CSN: [id number] Arrival date & time: 09/22/17  4098     History   Chief Complaint Chief Complaint  Patient presents with  . Asthma  . Chest Pain    HPI Joshua Roach is a 22 y.o. male.  HPI   22 year old male with past medical history of mild to moderate asthma here with cough and shortness of breath.  The patient states he has been around multiple sick contacts at work.  Over the last 2 days, he is developed cough, wheezing, and shortness of breath.  Has had nasal congestion and mild sore throat as well.  Denies any fevers.  He had an inhaler that was left over from his last exacerbation, but has run out of this over the last 24-48 hours.  He has had subsequent increase in shortness of breath.  He also endorses a occasional sharp chest pain and pressure-like sensation after coughing, that then resolves.  Denies any lower extremity swelling.  No history of blood clots.  No recent travel or immobilization.  Denies any sputum production or hemoptysis.  He has not recently been hospitalized.  He has never been intubated.  Symptoms seem worse with exposure to the cold. Past Medical History:  Diagnosis Date  . Asthma     There are no active problems to display for this patient.   History reviewed. No pertinent surgical history.     Home Medications    Prior to Admission medications   Medication Sig Start Date End Date Taking? Authorizing Provider  albuterol (PROVENTIL) (2.5 MG/3ML) 0.083% nebulizer solution Take 3 mLs (2.5 mg total) by nebulization every 4 (four) hours as needed for wheezing or shortness of breath. 09/22/17   Shaune Pollack, MD  benzonatate (TESSALON) 100 MG capsule Take 1 capsule (100 mg total) by mouth every 8 (eight) hours. Patient not taking: Reported on 05/15/2017 11/23/16   Demetrios Loll T, PA-C  cetirizine (ZYRTEC ALLERGY) 10 MG tablet Take 1 tablet (10 mg total) by mouth daily. 05/15/17    Maczis, Elmer Sow, PA-C  fluticasone (FLONASE) 50 MCG/ACT nasal spray Place 2 sprays into both nostrils daily. 05/15/17   Maczis, Elmer Sow, PA-C  predniSONE (DELTASONE) 20 MG tablet Take 3 tablets (60 mg total) by mouth daily for 5 days. 09/22/17 09/27/17  Shaune Pollack, MD    Family History History reviewed. No pertinent family history.  Social History Social History   Tobacco Use  . Smoking status: Former Smoker    Types: Cigarettes, Cigars  . Smokeless tobacco: Never Used  Substance Use Topics  . Alcohol use: No  . Drug use: Yes    Types: Marijuana    Comment: 2 times a week     Allergies   Patient has no known allergies.   Review of Systems Review of Systems  HENT: Positive for congestion, rhinorrhea and sore throat.   Respiratory: Positive for cough, shortness of breath and wheezing.   Cardiovascular: Positive for chest pain.  All other systems reviewed and are negative.    Physical Exam Updated Vital Signs BP (!) 156/94   Pulse 73   Temp 98 F (36.7 C) (Oral)   Resp 16   Ht 5\' 4"  (1.626 m)   Wt 92.1 kg (203 lb)   SpO2 99%   BMI 34.84 kg/m   Physical Exam  Constitutional: He is oriented to person, place, and time. He appears well-developed and well-nourished. No distress.  HENT:  Head:  Normocephalic and atraumatic.  Moderate posterior pharyngeal erythema without tonsillar swelling or exudates.  Moist mucous membranes.  No stridor.  Eyes: Conjunctivae are normal.  Neck: Neck supple.  Cardiovascular: Normal rate, regular rhythm and normal heart sounds. Exam reveals no friction rub.  No murmur heard. Pulmonary/Chest: Effort normal. No respiratory distress. He has wheezes (Scant, expiratory). He has no rales.  Normal work of breathing, speaking full sentences  Abdominal: He exhibits no distension.  Musculoskeletal: He exhibits no edema.  Neurological: He is alert and oriented to person, place, and time. He exhibits normal muscle tone.  Skin: Skin is  warm. Capillary refill takes less than 2 seconds.  Psychiatric: He has a normal mood and affect.  Nursing note and vitals reviewed.    ED Treatments / Results  Labs (all labs ordered are listed, but only abnormal results are displayed) Labs Reviewed  BASIC METABOLIC PANEL - Abnormal; Notable for the following components:      Result Value   Glucose, Bld 101 (*)    All other components within normal limits  CBC  I-STAT TROPONIN, ED    EKG  EKG Interpretation  Date/Time:  Friday September 22 2017 06:26:03 EST Ventricular Rate:  66 PR Interval:  134 QRS Duration: 94 QT Interval:  392 QTC Calculation: 410 R Axis:   106 Text Interpretation:  Normal sinus rhythm Rightward axis Cannot rule out Inferior infarct , age undetermined Abnormal ECG No significant change since last tracing Confirmed by Shaune PollackIsaacs, Beaulah Romanek [phone]) on 09/22/2017 9:10:38 AM       Radiology Dg Chest 2 View  Result Date: 09/22/2017 CLINICAL DATA:  Cough and chest tightness EXAM: CHEST  2 VIEW COMPARISON:  May 15, 2017 FINDINGS: Lungs are clear. Heart size and pulmonary vascularity are normal. No adenopathy. No pneumothorax. No bone lesions. IMPRESSION: No edema or consolidation. Electronically Signed   By: Bretta BangWilliam  Woodruff III M.D.   On: 09/22/2017 07:22    Procedures Procedures (including critical care time)  Medications Ordered in ED Medications  albuterol (PROVENTIL) (2.5 MG/3ML) 0.083% nebulizer solution (not administered)  predniSONE (DELTASONE) tablet 60 mg (not administered)  albuterol (PROVENTIL HFA;VENTOLIN HFA) 108 (90 Base) MCG/ACT inhaler 4 puff (not administered)  AEROCHAMBER PLUS FLO-VU LARGE MISC 1 each (not administered)     Initial Impression / Assessment and Plan / ED Course  I have reviewed the triage vital signs and the nursing notes.  Pertinent labs & imaging results that were available during my care of the patient were reviewed by me and considered in my medical decision  making (see chart for details).     22 year old male here with mild cough and wheezing in the setting of likely viral URI.  I suspect the patient has mild asthma exacerbation.  He is satting well on room air with normal work of breathing.  He does have mild expiratory wheezes.  Otherwise, EKG shows nonspecific changes but this is unchanged from previous with a negative troponin and I do not suspect ACS.  He is PERC negative and I do not suspect pulmonary embolism.  Lab work is otherwise reassuring.  Chest x-ray is without pneumonia.  No pneumothorax.  Patient given breathing treatment and steroids here and will discharge with a course of prednisone and outpatient follow-up.  Return precautions given.  Final Clinical Impressions(s) / ED Diagnoses   Final diagnoses:  Mild intermittent asthma with exacerbation    ED Discharge Orders        Ordered    predniSONE (DELTASONE)  20 MG tablet  Daily     09/22/17 0921    albuterol (PROVENTIL) (2.5 MG/3ML) 0.083% nebulizer solution  Every 4 hours PRN     09/22/17 0921       Shaune Pollack, MD 09/22/17 [phone]

## 2017-09-22 NOTE — Discharge Instructions (Signed)
Use your inhaler or nebulizer every 4-6 hours for the next 2 days.  Then, you can use it as needed for wheezing.  Take the full course of steroids.

## 2017-09-22 NOTE — ED Triage Notes (Signed)
Reports asthma exacerbation and chest tightness that's keeping him up. Dry cough. Non complaint with meds

## 2017-10-22 ENCOUNTER — Emergency Department (HOSPITAL_COMMUNITY)
Admission: EM | Admit: 2017-10-22 | Discharge: 2017-10-22 | Disposition: A | Discharge status: Home / Self Care | Payer: Self-pay | Attending: Physician Assistant | Admitting: Physician Assistant

## 2017-10-22 ENCOUNTER — Encounter (HOSPITAL_COMMUNITY): Payer: Self-pay | Admitting: Emergency Medicine

## 2017-10-22 DIAGNOSIS — Z79899 Other long term (current) drug therapy: Secondary | ICD-10-CM | POA: Insufficient documentation

## 2017-10-22 DIAGNOSIS — Z760 Encounter for issue of repeat prescription: Secondary | ICD-10-CM | POA: Insufficient documentation

## 2017-10-22 DIAGNOSIS — J45909 Unspecified asthma, uncomplicated: Secondary | ICD-10-CM | POA: Insufficient documentation

## 2017-10-22 DIAGNOSIS — Z87891 Personal history of nicotine dependence: Secondary | ICD-10-CM | POA: Insufficient documentation

## 2017-10-22 MED ORDER — ALBUTEROL SULFATE (2.5 MG/3ML) 0.083% IN NEBU: 2.5000 mg | INHALATION_SOLUTION | RESPIRATORY_TRACT | 0 refills | Status: DC | PRN

## 2017-10-22 MED ORDER — ALBUTEROL SULFATE HFA 108 (90 BASE) MCG/ACT IN AERS
2.0000 | INHALATION_SPRAY | Freq: Once | RESPIRATORY_TRACT | Status: AC
  Administered 2017-10-22: 2 via RESPIRATORY_TRACT
  Filled 2017-10-22: qty 6.7

## 2017-10-22 NOTE — ED Provider Notes (Signed)
[facility]-EMERGENCY DEPT Provider Note   CSN: [id number] Arrival date & time: 10/22/17  1442     History   Chief Complaint Chief Complaint  Patient presents with  . Medication Refill    HPI Joshua Roach is a 22 y.o. male with history of asthma who presents for medication refill of his asthma occasions.  He has been out of them and does not have a primary care provider.  He does not have any inhaler or nebulizer allergy medications but denies any other asthma medications.  He has been feeling well lately, however he is concerned to be without his medications because he has been doing physical activity at work and does not want to have a flareup of his asthma without his medications.  He denies any chest pain, shortness of breath, fevers.  HPI  Past Medical History:  Diagnosis Date  . Asthma     There are no active problems to display for this patient.   History reviewed. No pertinent surgical history.     Home Medications    Prior to Admission medications   Medication Sig Start Date End Date Taking? Authorizing Provider  albuterol (PROVENTIL) (2.5 MG/3ML) 0.083% nebulizer solution Take 3 mLs (2.5 mg total) by nebulization every 4 (four) hours as needed for wheezing or shortness of breath. 10/22/17   Gunnard Dorrance, Waylan Boga, PA-C  benzonatate (TESSALON) 100 MG capsule Take 1 capsule (100 mg total) by mouth every 8 (eight) hours. Patient not taking: Reported on 05/15/2017 11/23/16   Demetrios Loll T, PA-C  cetirizine (ZYRTEC ALLERGY) 10 MG tablet Take 1 tablet (10 mg total) by mouth daily. 05/15/17   Maczis, Elmer Sow, PA-C  fluticasone (FLONASE) 50 MCG/ACT nasal spray Place 2 sprays into both nostrils daily. 05/15/17   Maczis, Elmer Sow, PA-C    Family History No family history on file.  Social History Social History   Tobacco Use  . Smoking status: Former Smoker    Types: Cigarettes, Cigars  . Smokeless tobacco: Never Used  Substance Use Topics  .  Alcohol use: No  . Drug use: Yes    Types: Marijuana    Comment: 2 times a week     Allergies   Patient has no known allergies.   Review of Systems Review of Systems  Constitutional: Negative for fever.  Respiratory: Negative for cough and shortness of breath.   Cardiovascular: Negative for chest pain.     Physical Exam Updated Vital Signs BP (!) 154/66 (BP Location: Left Arm)   Pulse 71   Temp 98 F (36.7 C) (Oral)   Resp 18   SpO2 97%   Physical Exam  Constitutional: He appears well-developed and well-nourished. No distress.  HENT:  Head: Normocephalic and atraumatic.  Mouth/Throat: Oropharynx is clear and moist. No oropharyngeal exudate.  Eyes: Conjunctivae are normal. Pupils are equal, round, and reactive to light. Right eye exhibits no discharge. Left eye exhibits no discharge. No scleral icterus.  Neck: Normal range of motion. Neck supple. No thyromegaly present.  Cardiovascular: Normal rate, regular rhythm, normal heart sounds and intact distal pulses. Exam reveals no gallop and no friction rub.  No murmur heard. Pulmonary/Chest: Effort normal. No stridor. No respiratory distress. He has wheezes (faint expiratory in L lung). He has no rales.  Musculoskeletal: He exhibits no edema.  Lymphadenopathy:    He has no cervical adenopathy.  Neurological: He is alert. Coordination normal.  Skin: Skin is warm and dry. No rash noted. He is  not diaphoretic. No pallor.  Psychiatric: He has a normal mood and affect.  Nursing note and vitals reviewed.    ED Treatments / Results  Labs (all labs ordered are listed, but only abnormal results are displayed) Labs Reviewed - No data to display  EKG  EKG Interpretation None       Radiology No results found.  Procedures Procedures (including critical care time)  Medications Ordered in ED Medications  albuterol (PROVENTIL HFA;VENTOLIN HFA) 108 (90 Base) MCG/ACT inhaler 2 puff (not administered)     Initial  Impression / Assessment and Plan / ED Course  I have reviewed the triage vital signs and the nursing notes.  Pertinent labs & imaging results that were available during my care of the patient were reviewed by me and considered in my medical decision making (see chart for details).     Pt here for refill of asthma medication. Medication is not a controlled substance. Will refill inhaler and nebulizer solution.  Patient given resources to establish care with primary care provider.  Return precautions discussed.  Patient understands and agrees with plan.  Patient vitals stable throughout ED course and discharged in satisfactory condition.     Final Clinical Impressions(s) / ED Diagnoses   Final diagnoses:  Encounter for medication refill    ED Discharge Orders        Ordered    albuterol (PROVENTIL) (2.5 MG/3ML) 0.083% nebulizer solution  Every 4 hours PRN     10/22/17 1517       Emi HolesLaw, Lorance Pickeral M, PA-C 10/22/17 1519    Abelino DerrickMackuen, Courteney Lyn, MD 10/23/17 [phone]

## 2017-10-22 NOTE — ED Triage Notes (Signed)
Patient here for medication refill. Needs refill on albuterol inhaler and neb medication.

## 2017-10-22 NOTE — Discharge Instructions (Signed)
Please establish care with a primary care provider by calling 1 of the numbers below.  Please return to the emergency department if you develop any new or worsening symptoms.

## 2017-12-04 ENCOUNTER — Emergency Department (HOSPITAL_COMMUNITY)
Admission: EM | Admit: 2017-12-04 | Discharge: 2017-12-04 | Disposition: A | Discharge status: Home / Self Care | Payer: Self-pay | Attending: Emergency Medicine | Admitting: Emergency Medicine

## 2017-12-04 ENCOUNTER — Encounter (HOSPITAL_COMMUNITY): Payer: Self-pay | Admitting: *Deleted

## 2017-12-04 ENCOUNTER — Other Ambulatory Visit: Payer: Self-pay

## 2017-12-04 DIAGNOSIS — Z760 Encounter for issue of repeat prescription: Secondary | ICD-10-CM | POA: Insufficient documentation

## 2017-12-04 DIAGNOSIS — Z87891 Personal history of nicotine dependence: Secondary | ICD-10-CM | POA: Insufficient documentation

## 2017-12-04 DIAGNOSIS — Z79899 Other long term (current) drug therapy: Secondary | ICD-10-CM | POA: Insufficient documentation

## 2017-12-04 DIAGNOSIS — J45909 Unspecified asthma, uncomplicated: Secondary | ICD-10-CM | POA: Insufficient documentation

## 2017-12-04 MED ORDER — ALBUTEROL SULFATE (2.5 MG/3ML) 0.083% IN NEBU: 2.5000 mg | INHALATION_SOLUTION | Freq: Four times a day (QID) | RESPIRATORY_TRACT | 12 refills | Status: DC | PRN

## 2017-12-04 MED ORDER — ALBUTEROL SULFATE HFA 108 (90 BASE) MCG/ACT IN AERS: 1.0000 | INHALATION_SPRAY | Freq: Four times a day (QID) | RESPIRATORY_TRACT | 0 refills | Status: DC | PRN

## 2017-12-04 NOTE — Discharge Instructions (Addendum)
Contact [facility] to establish care with a primary care provider for regular, routine medical care.  This clinic accepts patients without medical insurance. A primary care provider can adjust your daily medications and give you refills.  °

## 2017-12-04 NOTE — ED Provider Notes (Signed)
[facility]   CSN: [id number] Arrival date & time: 12/04/17  1137     History   Chief Complaint Chief Complaint  Patient presents with  . Medication Refill    HPI Joshua Roach is a 22 y.o. male with history of asthma is here for medication refill for albuterol nebulizing solution and inhaler. Ran out 2 days ago.He denies fevers, chills, cough, wheezing. Is in the process of establishing care with a new provider. No tobacco use.   HPI  Past Medical History:  Diagnosis Date  . Asthma     There are no active problems to display for this patient.   History reviewed. No pertinent surgical history.      Home Medications    Prior to Admission medications   Medication Sig Start Date End Date Taking? Authorizing Provider  albuterol (PROVENTIL HFA;VENTOLIN HFA) 108 (90 Base) MCG/ACT inhaler Inhale 1-2 puffs into the lungs every 6 (six) hours as needed for wheezing or shortness of breath. 12/04/17   Liberty HandyGibbons, Keldrick Pomplun J, PA-C  albuterol (PROVENTIL) (2.5 MG/3ML) 0.083% nebulizer solution Take 3 mLs (2.5 mg total) by nebulization every 6 (six) hours as needed for wheezing or shortness of breath. 12/04/17   Liberty HandyGibbons, Rayaan Lorah J, PA-C  benzonatate (TESSALON) 100 MG capsule Take 1 capsule (100 mg total) by mouth every 8 (eight) hours. Patient not taking: Reported on 05/15/2017 11/23/16   Demetrios LollLeaphart, Kenneth T, PA-C  cetirizine (ZYRTEC ALLERGY) 10 MG tablet Take 1 tablet (10 mg total) by mouth daily. 05/15/17   Maczis, Elmer SowMichael M, PA-C  fluticasone (FLONASE) 50 MCG/ACT nasal spray Place 2 sprays into both nostrils daily. 05/15/17   Maczis, Elmer SowMichael M, PA-C    Family History History reviewed. No pertinent family history.  Social History Social History   Tobacco Use  . Smoking status: Former Smoker    Types: Cigarettes, Cigars  . Smokeless tobacco: Never Used  Substance Use Topics  . Alcohol use: No  . Drug use: Yes    Types: Marijuana   Comment: 2 times a week     Allergies   Patient has no known allergies.   Review of Systems Review of Systems  All other systems reviewed and are negative.    Physical Exam Updated Vital Signs BP 133/67 (BP Location: Right Arm)   Pulse 63   Temp 98.1 F (36.7 C) (Oral)   Resp 18   Ht 5\' 4"  (1.626 m)   Wt 90.7 kg (200 lb)   SpO2 99%   BMI 34.33 kg/m   Physical Exam  Constitutional: He is oriented to person, place, and time. He appears well-developed and well-nourished. No distress.  NAD.  HENT:  Head: Normocephalic and atraumatic.  Right Ear: External ear normal.  Left Ear: External ear normal.  Nose: Nose normal.  Eyes: Conjunctivae and EOM are normal. No scleral icterus.  Neck: Normal range of motion. Neck supple.  Cardiovascular: Normal rate, regular rhythm, normal heart sounds and intact distal pulses.  No murmur heard. Pulmonary/Chest: Effort normal and breath sounds normal. He has no wheezes.  Musculoskeletal: Normal range of motion. He exhibits no deformity.  Neurological: He is alert and oriented to person, place, and time.  Skin: Skin is warm and dry. Capillary refill takes less than 2 seconds.  Psychiatric: He has a normal mood and affect. His behavior is normal. Judgment and thought content normal.  Nursing note and vitals reviewed.    ED Treatments / Results  Labs (  all labs ordered are listed, but only abnormal results are displayed) Labs Reviewed - No data to display  EKG None  Radiology No results found.  Procedures Procedures (including critical care time)  Medications Ordered in ED Medications - No data to display   Initial Impression / Assessment and Plan / ED Course  I have reviewed the triage vital signs and the nursing notes.  Pertinent labs & imaging results that were available during my care of the patient were reviewed by me and considered in my medical decision making (see chart for details).    22 year old here for  medication refill. He has no associated systemic symptoms of asthma exacerbation, pneumonia. Vital signs within normal limits. He has no other complaints today. Prescription given. Encouraged follow-up with primary care provider or [facility] for future refills.  Final Clinical Impressions(s) / ED Diagnoses   Final diagnoses:  Medication refill    ED Discharge Orders        Ordered    albuterol (PROVENTIL HFA;VENTOLIN HFA) 108 (90 Base) MCG/ACT inhaler  Every 6 hours PRN     12/04/17 1412    albuterol (PROVENTIL) (2.5 MG/3ML) 0.083% nebulizer solution  Every 6 hours PRN     12/04/17 1412       Liberty Handy, PA-C 12/04/17 1413    Melene Plan, DO 12/04/17 1524

## 2017-12-04 NOTE — ED Triage Notes (Signed)
Pt has no complaints. Only wants medication refill for his inhaler and nebulizer.

## 2018-01-04 ENCOUNTER — Encounter (HOSPITAL_COMMUNITY): Payer: Self-pay | Admitting: *Deleted

## 2018-01-04 ENCOUNTER — Emergency Department (HOSPITAL_COMMUNITY)
Admission: EM | Admit: 2018-01-04 | Discharge: 2018-01-04 | Disposition: A | Discharge status: Home / Self Care | Payer: Self-pay | Attending: Emergency Medicine | Admitting: Emergency Medicine

## 2018-01-04 DIAGNOSIS — J45901 Unspecified asthma with (acute) exacerbation: Secondary | ICD-10-CM | POA: Insufficient documentation

## 2018-01-04 DIAGNOSIS — Z87891 Personal history of nicotine dependence: Secondary | ICD-10-CM | POA: Insufficient documentation

## 2018-01-04 DIAGNOSIS — Z79899 Other long term (current) drug therapy: Secondary | ICD-10-CM | POA: Insufficient documentation

## 2018-01-04 MED ORDER — IPRATROPIUM-ALBUTEROL 0.5-2.5 (3) MG/3ML IN SOLN
3.0000 mL | Freq: Once | RESPIRATORY_TRACT | Status: AC
  Administered 2018-01-04: 3 mL via RESPIRATORY_TRACT
  Filled 2018-01-04: qty 3

## 2018-01-04 MED ORDER — DEXAMETHASONE 4 MG PO TABS
12.0000 mg | ORAL_TABLET | Freq: Once | ORAL | Status: AC
  Administered 2018-01-04: 12 mg via ORAL
  Filled 2018-01-04: qty 3

## 2018-01-04 MED ORDER — ALBUTEROL SULFATE HFA 108 (90 BASE) MCG/ACT IN AERS
2.0000 | INHALATION_SPRAY | Freq: Once | RESPIRATORY_TRACT | Status: AC
  Administered 2018-01-04: 2 via RESPIRATORY_TRACT
  Filled 2018-01-04: qty 6.7

## 2018-01-04 NOTE — ED Triage Notes (Signed)
Pt states he woke up this morning wheezing and coughing. Pt has hx of asthma. Pt states he ran out of his inhaler and does not have insurance.

## 2018-01-04 NOTE — ED Provider Notes (Signed)
[facility]-EMERG[facility] DEPT Provider Note   CSN: [id number] Arrival date & time: 01/04/18  0710     History   Chief Complaint Chief Complaint  Patient presents with  . Wheezing    HPI Joshua Roach is a 22 y.o. male.  HPI   22 year old male with dyspnea.  He has a past history of asthma.  He reports he woke up this morning he felt mildly short of breath was wheezing and coughing.  He was in his usual state of health over the past few days.  He currently is out of his inhaler.  Came to the emergency room seeking treatment and requesting an inhaler.  Denies any fevers or chills.  No unusual leg pain or swelling.  His chest feels somewhat tight, but not really painful.  Past Medical History:  Diagnosis Date  . Asthma     There are no active problems to display for this patient.   History reviewed. No pertinent surgical history.      Home Medications    Prior to Admission medications   Medication Sig Start Date End Date Taking? Authorizing Provider  albuterol (PROVENTIL HFA;VENTOLIN HFA) 108 (90 Base) MCG/ACT inhaler Inhale 1-2 puffs into the lungs every 6 (six) hours as needed for wheezing or shortness of breath. 12/04/17   Liberty Handy, PA-C  albuterol (PROVENTIL) (2.5 MG/3ML) 0.083% nebulizer solution Take 3 mLs (2.5 mg total) by nebulization every 6 (six) hours as needed for wheezing or shortness of breath. 12/04/17   Liberty Handy, PA-C  benzonatate (TESSALON) 100 MG capsule Take 1 capsule (100 mg total) by mouth every 8 (eight) hours. Patient not taking: Reported on 05/15/2017 11/23/16   Demetrios Loll T, PA-C  cetirizine (ZYRTEC ALLERGY) 10 MG tablet Take 1 tablet (10 mg total) by mouth daily. 05/15/17   Maczis, Elmer Sow, PA-C  fluticasone (FLONASE) 50 MCG/ACT nasal spray Place 2 sprays into both nostrils daily. 05/15/17   Maczis, Elmer Sow, PA-C    Family History No family history on file.  Social History Social History   Tobacco Use    . Smoking status: Former Smoker    Types: Cigarettes, Cigars  . Smokeless tobacco: Never Used  Substance Use Topics  . Alcohol use: No  . Drug use: Yes    Types: Marijuana    Comment: 2 times a week     Allergies   Patient has no known allergies.   Review of Systems Review of Systems  All systems reviewed and negative, other than as noted in HPI.   Physical Exam Updated Vital Signs BP (!) 156/79 (BP Location: Right Arm)   Pulse 72   Temp 98 F (36.7 C) (Oral)   Resp 18   SpO2 97%   Physical Exam  Constitutional: He appears well-developed and well-nourished. No distress.  Sitting up in bed.  Nontoxic.  Can speak in complete sentences.  HENT:  Head: Normocephalic and atraumatic.  Eyes: Conjunctivae are normal. Right eye exhibits no discharge. Left eye exhibits no discharge.  Neck: Neck supple.  Cardiovascular: Normal rate, regular rhythm and normal heart sounds. Exam reveals no gallop and no friction rub.  No murmur heard. Pulmonary/Chest: Effort normal. No respiratory distress. He has wheezes.  Faint expiratory wheezing bilaterally.  Overall he appears reasonably comfortable.  He has no increased work of breathing.  Abdominal: Soft. He exhibits no distension. There is no tenderness.  Musculoskeletal: He exhibits no edema or tenderness.  Lower extremities symmetric as compared  to each other. No calf tenderness. Negative Homan's. No palpable cords.   Neurological: He is alert.  Skin: Skin is warm and dry.  Psychiatric: He has a normal mood and affect. His behavior is normal. Thought content normal.  Nursing note and vitals reviewed.    ED Treatments / Results  Labs (all labs ordered are listed, but only abnormal results are displayed) Labs Reviewed - No data to display  EKG None  Radiology No results found.  Procedures Procedures (including critical care time)  Medications Ordered in ED Medications  ipratropium-albuterol (DUONEB) 0.5-2.5 (3) MG/3ML  nebulizer solution 3 mL (has no administration in time range)  dexamethasone (DECADRON) tablet 12 mg (has no administration in time range)     Initial Impression / Assessment and Plan / ED Course  I have reviewed the triage vital signs and the nursing notes.  Pertinent labs & imaging results that were available during my care of the patient were reviewed by me and considered in my medical decision making (see chart for details).     22 year old male with what is clinically mild asthma exacerbation.  He was given at treatment the emergency room and a dose of Decadron.  He was provided with an albuterol inhaler.  Advised to establish primary care.  Return precautions were discussed.  I doubt emergent alternative etiology such as ACS, PE, pneumonia or other.  Final Clinical Impressions(s) / ED Diagnoses   Final diagnoses:  Exacerbation of asthma, unspecified asthma severity, unspecified whether persistent    ED Discharge Orders    None       Raeford Razor, MD 01/10/18 1136

## 2018-01-21 ENCOUNTER — Encounter (HOSPITAL_COMMUNITY): Payer: Self-pay | Admitting: Emergency Medicine

## 2018-01-21 ENCOUNTER — Emergency Department (HOSPITAL_COMMUNITY)
Admission: EM | Admit: 2018-01-21 | Discharge: 2018-01-21 | Disposition: A | Discharge status: Home / Self Care | Payer: Self-pay | Attending: Emergency Medicine | Admitting: Emergency Medicine

## 2018-01-21 ENCOUNTER — Emergency Department (HOSPITAL_COMMUNITY): Payer: Self-pay

## 2018-01-21 DIAGNOSIS — F1210 Cannabis abuse, uncomplicated: Secondary | ICD-10-CM | POA: Insufficient documentation

## 2018-01-21 DIAGNOSIS — R0789 Other chest pain: Secondary | ICD-10-CM | POA: Insufficient documentation

## 2018-01-21 DIAGNOSIS — J4521 Mild intermittent asthma with (acute) exacerbation: Secondary | ICD-10-CM | POA: Insufficient documentation

## 2018-01-21 DIAGNOSIS — Z87891 Personal history of nicotine dependence: Secondary | ICD-10-CM | POA: Insufficient documentation

## 2018-01-21 MED ORDER — ALBUTEROL SULFATE (2.5 MG/3ML) 0.083% IN NEBU
5.0000 mg | INHALATION_SOLUTION | Freq: Once | RESPIRATORY_TRACT | Status: AC
  Administered 2018-01-21: 5 mg via RESPIRATORY_TRACT
  Filled 2018-01-21: qty 6

## 2018-01-21 MED ORDER — ALBUTEROL SULFATE HFA 108 (90 BASE) MCG/ACT IN AERS
2.0000 | INHALATION_SPRAY | RESPIRATORY_TRACT | Status: DC | PRN
  Administered 2018-01-21: 2 via RESPIRATORY_TRACT
  Filled 2018-01-21: qty 6.7

## 2018-01-21 MED ORDER — PREDNISONE 20 MG PO TABS
60.0000 mg | ORAL_TABLET | Freq: Once | ORAL | Status: AC
  Administered 2018-01-21: 60 mg via ORAL
  Filled 2018-01-21: qty 3

## 2018-01-21 MED ORDER — AEROCHAMBER PLUS FLO-VU MEDIUM MISC: 1.0000 | Freq: Once | Status: DC

## 2018-01-21 MED ORDER — PREDNISONE 10 MG PO TABS: 40.0000 mg | ORAL_TABLET | Freq: Every day | ORAL | 0 refills | Status: AC

## 2018-01-21 MED ORDER — IPRATROPIUM BROMIDE 0.02 % IN SOLN
0.5000 mg | Freq: Once | RESPIRATORY_TRACT | Status: AC
  Administered 2018-01-21: 0.5 mg via RESPIRATORY_TRACT
  Filled 2018-01-21: qty 2.5

## 2018-01-21 NOTE — Discharge Instructions (Addendum)
As discussed, use your inhaler with spacer as needed every 4 hours and start taking prednisone for the next 4 days starting tomorrow.  Follow up with the wellness center.   Return sooner if symptoms worsen, difficulty breathing, severe wheezing, dizziness, lightheadedness, chest pain or other new concerning symptoms in the meantime.

## 2018-01-21 NOTE — ED Provider Notes (Signed)
[facility]-EMERGENCY DEPT Provider Note   CSN: [id number] Arrival date & time: 01/21/18  0754     History   Chief Complaint Chief Complaint  Patient presents with  . Asthma    HPI Joshua Roach is a 22 y.o. male with past medical history significant for asthma presenting with sudden onset wheezing, chest tightness last night.  Patient reports that he has been out of his albuterol inhaler at home and has tried hot steam without relief.  He reports improvement after his initial DuoNeb in triage but feels a sharp chest discomfort on the right side.  He explains that he tends to have a hard time with the environmental changes and allergies.  He denies any fever, chills, cough, congestion. Patient has been admitted to the hospital as a child for asthma exacerbation and reports last use of steroids approximately 1 month ago.  He denies ever requiring intubation.  HPI  Past Medical History:  Diagnosis Date  . Asthma     There are no active problems to display for this patient.   History reviewed. No pertinent surgical history.      Home Medications    Prior to Admission medications   Medication Sig Start Date End Date Taking? Authorizing Provider  albuterol (PROVENTIL HFA;VENTOLIN HFA) 108 (90 Base) MCG/ACT inhaler Inhale 1-2 puffs into the lungs every 6 (six) hours as needed for wheezing or shortness of breath. 12/04/17   Liberty Handy, PA-C  albuterol (PROVENTIL) (2.5 MG/3ML) 0.083% nebulizer solution Take 3 mLs (2.5 mg total) by nebulization every 6 (six) hours as needed for wheezing or shortness of breath. 12/04/17   Liberty Handy, PA-C  benzonatate (TESSALON) 100 MG capsule Take 1 capsule (100 mg total) by mouth every 8 (eight) hours. Patient not taking: Reported on 05/15/2017 11/23/16   Demetrios Loll T, PA-C  cetirizine (ZYRTEC ALLERGY) 10 MG tablet Take 1 tablet (10 mg total) by mouth daily. 05/15/17   Maczis, Elmer Sow, PA-C  fluticasone  (FLONASE) 50 MCG/ACT nasal spray Place 2 sprays into both nostrils daily. 05/15/17   Maczis, Elmer Sow, PA-C  predniSONE (DELTASONE) 10 MG tablet Take 4 tablets (40 mg total) by mouth daily for 4 days. 01/21/18 01/25/18  Georgiana Shore, PA-C    Family History No family history on file.  Social History Social History   Tobacco Use  . Smoking status: Former Smoker    Types: Cigarettes, Cigars  . Smokeless tobacco: Never Used  Substance Use Topics  . Alcohol use: Yes  . Drug use: Yes    Types: Marijuana    Comment: 2 times a week     Allergies   Patient has no known allergies.   Review of Systems Review of Systems  Constitutional: Negative for chills, diaphoresis, fatigue and fever.  HENT: Negative for congestion, sore throat and trouble swallowing.   Respiratory: Positive for chest tightness and wheezing. Negative for apnea, cough, choking, shortness of breath and stridor.   Cardiovascular: Negative for chest pain, palpitations and leg swelling.  Gastrointestinal: Negative for nausea and vomiting.  Musculoskeletal: Negative for myalgias, neck pain and neck stiffness.  Skin: Negative for color change, pallor and rash.  Neurological: Negative for dizziness, light-headedness and headaches.     Physical Exam Updated Vital Signs BP 127/78 (BP Location: Right Arm)   Pulse 79   Temp 98.4 F (36.9 C) (Oral)   Resp 16   SpO2 100%   Physical Exam  Constitutional: He appears well-developed  and well-nourished. No distress.  Afebrile, nontoxic-appearing, sitting comfortably in chair.  O2 sats 100% on room air no acute respiratory distress.  HENT:  Head: Normocephalic and atraumatic.  Right Ear: External ear normal.  Left Ear: External ear normal.  Mouth/Throat: Oropharynx is clear and moist. No oropharyngeal exudate.  Eyes: Conjunctivae and EOM are normal. Right eye exhibits no discharge. Left eye exhibits no discharge.  Neck: Normal range of motion. Neck supple.    Cardiovascular: Normal rate, regular rhythm and normal heart sounds.  No murmur heard. Pulmonary/Chest: Effort normal. No stridor. No respiratory distress. He has wheezes. He has no rales. He exhibits no tenderness.  Expiratory wheezing worse on the right than the left.  Abdominal: He exhibits no distension.  Musculoskeletal: Normal range of motion. He exhibits no edema.  Lymphadenopathy:    He has no cervical adenopathy.  Neurological: He is alert.  Skin: Skin is warm and dry. No rash noted. He is not diaphoretic. No erythema. No pallor.  Psychiatric: He has a normal mood and affect.  Nursing note and vitals reviewed.    ED Treatments / Results  Labs (all labs ordered are listed, but only abnormal results are displayed) Labs Reviewed - No data to display  EKG None  Radiology Dg Chest 2 View  Result Date: 01/21/2018 CLINICAL DATA:  Chest tightness EXAM: CHEST - 2 VIEW COMPARISON:  09/22/2017 FINDINGS: Normal heart size. Lungs clear. No pneumothorax. No pleural effusion. IMPRESSION: No active cardiopulmonary disease. Electronically Signed   By: Jolaine Click M.D.   On: 01/21/2018 12:59    Procedures Procedures (including critical care time)  Medications Ordered in ED Medications  albuterol (PROVENTIL HFA;VENTOLIN HFA) 108 (90 Base) MCG/ACT inhaler 2 puff (2 puffs Inhalation Given 01/21/18 1246)  AEROCHAMBER PLUS FLO-VU MEDIUM MISC 1 each (1 each Other Refused 01/21/18 1336)  albuterol (PROVENTIL) (2.5 MG/3ML) 0.083% nebulizer solution 5 mg (5 mg Nebulization Given 01/21/18 0809)  predniSONE (DELTASONE) tablet 60 mg (60 mg Oral Given 01/21/18 1241)  albuterol (PROVENTIL) (2.5 MG/3ML) 0.083% nebulizer solution 5 mg (5 mg Nebulization Given 01/21/18 1241)  ipratropium (ATROVENT) nebulizer solution 0.5 mg (0.5 mg Nebulization Given 01/21/18 1241)     Initial Impression / Assessment and Plan / ED Course  I have reviewed the triage vital signs and the nursing notes.  Pertinent labs  & imaging results that were available during my care of the patient were reviewed by me and considered in my medical decision making (see chart for details).    Presenting with asthma exacerbation with expiratory wheezing worse on the right side with associated chest tightness.  Chest X-ray negative  He was given prednisone and nebulizing treatment while in the emergency department with significant improvement. On final reassessment, patient was no longer wheezing on exam.  Patient was provided with albuterol MDI as needed. He reports already having a spacer at home.  Will discharge patient home with short prednisone burst and close follow-up with PCP  Discussed strict return precautions and advised to return to the emergency department if experiencing any new or worsening symptoms. Instructions were understood and patient agreed with discharge plan. Final Clinical Impressions(s) / ED Diagnoses   Final diagnoses:  Mild intermittent asthma with exacerbation    ED Discharge Orders        Ordered    predniSONE (DELTASONE) 10 MG tablet  Daily     01/21/18 1429       Gregary Cromer 01/21/18 1432    Tilden Fossa,  MD 01/22/18 1610

## 2018-01-21 NOTE — ED Triage Notes (Signed)
Pt reports has asthma and out of inhaler. Reports woke up during night with chest tightness.

## 2018-01-21 NOTE — ED Notes (Signed)
Bed: WTR6 Expected date:  Expected time:  Means of arrival:  Comments: 

## 2018-02-11 ENCOUNTER — Emergency Department (HOSPITAL_COMMUNITY)
Admission: EM | Admit: 2018-02-11 | Discharge: 2018-02-11 | Disposition: A | Discharge status: Home / Self Care | Payer: Self-pay | Attending: Emergency Medicine | Admitting: Emergency Medicine

## 2018-02-11 ENCOUNTER — Other Ambulatory Visit: Payer: Self-pay

## 2018-02-11 ENCOUNTER — Encounter (HOSPITAL_COMMUNITY): Payer: Self-pay | Admitting: Nurse Practitioner

## 2018-02-11 DIAGNOSIS — R062 Wheezing: Secondary | ICD-10-CM | POA: Insufficient documentation

## 2018-02-11 DIAGNOSIS — Z760 Encounter for issue of repeat prescription: Secondary | ICD-10-CM

## 2018-02-11 DIAGNOSIS — J4520 Mild intermittent asthma, uncomplicated: Secondary | ICD-10-CM

## 2018-02-11 DIAGNOSIS — Z87891 Personal history of nicotine dependence: Secondary | ICD-10-CM | POA: Insufficient documentation

## 2018-02-11 MED ORDER — PREDNISONE 10 MG PO TABS: 40.0000 mg | ORAL_TABLET | Freq: Every day | ORAL | 0 refills | Status: DC

## 2018-02-11 MED ORDER — ALBUTEROL SULFATE HFA 108 (90 BASE) MCG/ACT IN AERS
2.0000 | INHALATION_SPRAY | RESPIRATORY_TRACT | Status: DC | PRN
  Administered 2018-02-11: 2 via RESPIRATORY_TRACT
  Filled 2018-02-11: qty 6.7

## 2018-02-11 MED ORDER — IPRATROPIUM-ALBUTEROL 0.5-2.5 (3) MG/3ML IN SOLN
3.0000 mL | Freq: Once | RESPIRATORY_TRACT | Status: AC
  Administered 2018-02-11: 3 mL via RESPIRATORY_TRACT
  Filled 2018-02-11: qty 3

## 2018-02-11 MED ORDER — PREDNISONE 20 MG PO TABS
40.0000 mg | ORAL_TABLET | Freq: Once | ORAL | Status: AC
  Administered 2018-02-11: 40 mg via ORAL
  Filled 2018-02-11: qty 2

## 2018-02-11 NOTE — ED Triage Notes (Signed)
Pt is requesting refill prescription for his rescue inhaler albuterol. States he does not have a PCP. [facility].

## 2018-02-11 NOTE — ED Provider Notes (Signed)
[facility]-EMERGENCY DEPT Provider Note   CSN: [id number] Arrival date & time: 02/11/18  1645     History   Chief Complaint Chief Complaint  Patient presents with  . Medication Refill    Rescue Inhailer    HPI Joshua Roach is a 22 y.o. male who presents to the ED with request for medication refill. Patient reports he is out of his rescue inhaler, Albuterol. Patient does not have a PCP currently. Patient reports wheezing that started this morning and has gotten worse.   HPI  Past Medical History:  Diagnosis Date  . Asthma     There are no active problems to display for this patient.   History reviewed. No pertinent surgical history.      Home Medications    Prior to Admission medications   Medication Sig Start Date End Date Taking? Authorizing Provider  albuterol (PROVENTIL HFA;VENTOLIN HFA) 108 (90 Base) MCG/ACT inhaler Inhale 1-2 puffs into the lungs every 6 (six) hours as needed for wheezing or shortness of breath. 12/04/17   Liberty HandyGibbons, Claudia J, PA-C  albuterol (PROVENTIL) (2.5 MG/3ML) 0.083% nebulizer solution Take 3 mLs (2.5 mg total) by nebulization every 6 (six) hours as needed for wheezing or shortness of breath. 12/04/17   Liberty HandyGibbons, Claudia J, PA-C  cetirizine (ZYRTEC ALLERGY) 10 MG tablet Take 1 tablet (10 mg total) by mouth daily. 05/15/17   Maczis, Elmer SowMichael M, PA-C  fluticasone (FLONASE) 50 MCG/ACT nasal spray Place 2 sprays into both nostrils daily. 05/15/17   Maczis, Elmer SowMichael M, PA-C  predniSONE (DELTASONE) 10 MG tablet Take 4 tablets (40 mg total) by mouth daily. 02/11/18   Janne NapoleonNeese, Seleena Reimers M, NP    Family History History reviewed. No pertinent family history.  Social History Social History   Tobacco Use  . Smoking status: Former Smoker    Types: Cigarettes, Cigars  . Smokeless tobacco: Never Used  Substance Use Topics  . Alcohol use: Yes  . Drug use: Yes    Types: Marijuana    Comment: 2 times a week     Allergies   Patient has no  known allergies.   Review of Systems Review of Systems  Respiratory: Positive for cough and wheezing.   All other systems reviewed and are negative.    Physical Exam Updated Vital Signs BP (!) 152/81 (BP Location: Left Arm)   Pulse 64   Temp 98.1 F (36.7 C) (Oral)   Resp 18   SpO2 98%   Physical Exam  Constitutional: He appears well-developed and well-nourished. No distress.  HENT:  Head: Normocephalic.  Eyes: EOM are normal.  Neck: Neck supple.  Cardiovascular: Normal rate and regular rhythm.  Pulmonary/Chest: Effort normal. He has wheezes.  Inspiratory and expiratory wheezes bilateral.  Musculoskeletal: Normal range of motion.  Neurological: He is alert.  Skin: Skin is warm and dry.  Psychiatric: He has a normal mood and affect.  Nursing note and vitals reviewed.    ED Treatments / Results  Labs (all labs ordered are listed, but only abnormal results are displayed) Labs Reviewed - No data to display Radiology No results found.  Procedures Procedures (including critical care time)  Medications Ordered in ED Medications  albuterol (PROVENTIL HFA;VENTOLIN HFA) 108 (90 Base) MCG/ACT inhaler 2 puff (2 puffs Inhalation Provided for home use 02/11/18 1749)  ipratropium-albuterol (DUONEB) 0.5-2.5 (3) MG/3ML nebulizer solution 3 mL (3 mLs Nebulization Given 02/11/18 1748)  predniSONE (DELTASONE) tablet 40 mg (40 mg Oral Given 02/11/18 1747)  Patient re examined after neb treatment and lungs are clear. Patient stable for d/c without respiratory distress and O2 SAT 98% on R/A. Patient given Albuterol inhaler prior to d/c and Rx for steroid burst. He will f/u with [facility]. He will return here for worsening symptoms.   Initial Impression / Assessment and Plan / ED Course  I have reviewed the triage vital signs and the nursing notes.  Final Clinical Impressions(s) / ED Diagnoses   Final diagnoses:  Mild intermittent asthma without complication    Medication refill    ED Discharge Orders        Ordered    predniSONE (DELTASONE) 10 MG tablet  Daily     02/11/18 1823       Kerrie Buffalo Ryder, NP 02/11/18 Silva Bandy    Mancel Bale, MD 02/11/18 2316

## 2018-02-11 NOTE — Discharge Instructions (Addendum)
Call [facility] for a follow up appointment. Use your inhaler as needed. Do not start the prednisone until tomorrow since we gave you your first dose here tonight. Return as needed for worsening symptoms.

## 2018-03-07 ENCOUNTER — Encounter (HOSPITAL_COMMUNITY): Payer: Self-pay

## 2018-03-07 ENCOUNTER — Emergency Department (HOSPITAL_COMMUNITY)
Admission: EM | Admit: 2018-03-07 | Discharge: 2018-03-07 | Disposition: A | Discharge status: Home / Self Care | Payer: Self-pay | Attending: Emergency Medicine | Admitting: Emergency Medicine

## 2018-03-07 DIAGNOSIS — Z87891 Personal history of nicotine dependence: Secondary | ICD-10-CM | POA: Insufficient documentation

## 2018-03-07 DIAGNOSIS — J4541 Moderate persistent asthma with (acute) exacerbation: Secondary | ICD-10-CM | POA: Insufficient documentation

## 2018-03-07 DIAGNOSIS — Z79899 Other long term (current) drug therapy: Secondary | ICD-10-CM | POA: Insufficient documentation

## 2018-03-07 MED ORDER — PREDNISONE 20 MG PO TABS: 60.0000 mg | ORAL_TABLET | Freq: Every day | ORAL | 0 refills | Status: DC

## 2018-03-07 MED ORDER — ALBUTEROL SULFATE (2.5 MG/3ML) 0.083% IN NEBU
5.0000 mg | INHALATION_SOLUTION | Freq: Once | RESPIRATORY_TRACT | Status: AC
  Administered 2018-03-07: 5 mg via RESPIRATORY_TRACT
  Filled 2018-03-07: qty 6

## 2018-03-07 MED ORDER — ALBUTEROL SULFATE HFA 108 (90 BASE) MCG/ACT IN AERS
2.0000 | INHALATION_SPRAY | Freq: Once | RESPIRATORY_TRACT | Status: AC
  Administered 2018-03-07: 2 via RESPIRATORY_TRACT
  Filled 2018-03-07: qty 6.7

## 2018-03-07 MED ORDER — PREDNISONE 20 MG PO TABS
60.0000 mg | ORAL_TABLET | Freq: Once | ORAL | Status: AC
  Administered 2018-03-07: 60 mg via ORAL
  Filled 2018-03-07: qty 3

## 2018-03-07 NOTE — ED Triage Notes (Signed)
Patient reports that he does not currently have an albuterol inhaler and has wheezing yesterday and worse today.

## 2018-03-07 NOTE — ED Provider Notes (Signed)
[facility]-EMERGENCY DEPT Provider Note   CSN: [id number] Arrival date & time: 03/07/18  [id number]     History   Chief Complaint Chief Complaint  Patient presents with  . Asthma    HPI Joshua Roach is a 22 y.o. male with history of asthma who presents with a 2-day history of wheezing, shortness of breath, chest tightness, intermittent cough.  Patient reports his symptoms feel typical of his normal asthma exacerbation.  His last exacerbation was last week.  He did not seek medical treatment and it passed on its own.  He has not had an inhaler at home.  He does not have a PCP.  He denies any abdominal pain, nausea, vomiting, sore throat, ear pain, fever.  HPI  Past Medical History:  Diagnosis Date  . Asthma     There are no active problems to display for this patient.   History reviewed. No pertinent surgical history.      Home Medications    Prior to Admission medications   Medication Sig Start Date End Date Taking? Authorizing Provider  albuterol (PROVENTIL HFA;VENTOLIN HFA) 108 (90 Base) MCG/ACT inhaler Inhale 1-2 puffs into the lungs every 6 (six) hours as needed for wheezing or shortness of breath. 12/04/17  Yes Sharen HeckGibbons, Claudia J, PA-C  albuterol (PROVENTIL) (2.5 MG/3ML) 0.083% nebulizer solution Take 3 mLs (2.5 mg total) by nebulization every 6 (six) hours as needed for wheezing or shortness of breath. 12/04/17  Yes Liberty HandyGibbons, Claudia J, PA-C  cetirizine (ZYRTEC ALLERGY) 10 MG tablet Take 1 tablet (10 mg total) by mouth daily. 05/15/17  Yes Maczis, Elmer SowMichael M, PA-C  fluticasone (FLONASE) 50 MCG/ACT nasal spray Place 2 sprays into both nostrils daily. Patient not taking: Reported on 03/07/2018 05/15/17   Maczis, Elmer SowMichael M, PA-C  predniSONE (DELTASONE) 20 MG tablet Take 3 tablets (60 mg total) by mouth daily. 03/07/18   Emi HolesLaw, Georgia Baria M, PA-C    Family History History reviewed. No pertinent family history.  Social History Social History   Tobacco Use  .  Smoking status: Former Smoker    Types: Cigarettes, Cigars  . Smokeless tobacco: Never Used  Substance Use Topics  . Alcohol use: Yes  . Drug use: Yes    Types: Marijuana    Comment: 2 times a week     Allergies   Patient has no known allergies.   Review of Systems Review of Systems  Constitutional: Negative for chills and fever.  HENT: Negative for facial swelling and sore throat.   Respiratory: Positive for cough, chest tightness, shortness of breath and wheezing.   Cardiovascular: Negative for chest pain.  Gastrointestinal: Negative for abdominal pain, nausea and vomiting.  Genitourinary: Negative for dysuria.  Musculoskeletal: Negative for back pain.  Skin: Negative for rash and wound.  Neurological: Negative for headaches.  Psychiatric/Behavioral: The patient is not nervous/anxious.      Physical Exam Updated Vital Signs BP 131/78   Pulse 79   Temp 97.7 F (36.5 C) (Oral)   Resp 18   Ht 5\' 5"  (1.651 m)   Wt 95.3 kg (210 lb)   SpO2 99%   BMI 34.95 kg/m   Physical Exam  Constitutional: He appears well-developed and well-nourished. No distress.  HENT:  Head: Normocephalic and atraumatic.  Right Ear: Tympanic membrane normal.  Left Ear: Tympanic membrane normal.  Mouth/Throat: Oropharynx is clear and moist. No oropharyngeal exudate, posterior oropharyngeal edema, posterior oropharyngeal erythema or tonsillar abscesses. Tonsils are 1+ on the right. Tonsils are  1+ on the left. No tonsillar exudate.  Eyes: Pupils are equal, round, and reactive to light. Conjunctivae are normal. Right eye exhibits no discharge. Left eye exhibits no discharge. No scleral icterus.  Neck: Normal range of motion. Neck supple. No thyromegaly present.  Cardiovascular: Normal rate, regular rhythm, normal heart sounds and intact distal pulses. Exam reveals no gallop and no friction rub.  No murmur heard. Pulmonary/Chest: Effort normal. No stridor. No respiratory distress. He has wheezes  (expiratory (faint) upper lung fields (neb in process)). He has no rales.  Abdominal: Soft. Bowel sounds are normal. He exhibits no distension. There is no tenderness. There is no rebound and no guarding.  Musculoskeletal: He exhibits no edema.  Lymphadenopathy:    He has no cervical adenopathy.  Neurological: He is alert. Coordination normal.  Skin: Skin is warm and dry. No rash noted. He is not diaphoretic. No pallor.  Psychiatric: He has a normal mood and affect.  Nursing note and vitals reviewed.    ED Treatments / Results  Labs (all labs ordered are listed, but only abnormal results are displayed) Labs Reviewed - No data to display  EKG None  Radiology No results found.  Procedures Procedures (including critical care time)  Medications Ordered in ED Medications  albuterol (PROVENTIL) (2.5 MG/3ML) 0.083% nebulizer solution 5 mg (5 mg Nebulization Given 03/07/18 0810)  predniSONE (DELTASONE) tablet 60 mg (60 mg Oral Given 03/07/18 0830)  albuterol (PROVENTIL HFA;VENTOLIN HFA) 108 (90 Base) MCG/ACT inhaler 2 puff (2 puffs Inhalation Given 03/07/18 0831)     Initial Impression / Assessment and Plan / ED Course  I have reviewed the triage vital signs and the nursing notes.  Pertinent labs & imaging results that were available during my care of the patient were reviewed by me and considered in my medical decision making (see chart for details).     Patient ambulated in ED with O2 saturations maintained >95%, no current signs of respiratory distress. Lung exam improved after nebulizer treatment, now clear. Prednisone given in the ED and pt will bd dc with 5 day burst. Pt states they are breathing at baseline.  Patient advised to follow-up and establish care with PCP.  Patient given inhaler to go home with.  Return precautions discussed.  Patient understands and agrees with plan.  Patient vitals stable throughout ED course and discharged in satisfactory condition.  Final Clinical  Impressions(s) / ED Diagnoses   Final diagnoses:  Moderate persistent asthma with exacerbation    ED Discharge Orders        Ordered    predniSONE (DELTASONE) 20 MG tablet  Daily     03/07/18 0923       Emi Holes, PA-C 03/07/18 4540    Arby Barrette, MD 03/11/18 [phone]

## 2018-03-07 NOTE — ED Notes (Signed)
Pt ambulated without difficulty. Pts o2 95% while ambulating

## 2018-03-07 NOTE — ED Notes (Signed)
Pt reports feeling better after asthma treatment

## 2018-03-07 NOTE — Discharge Instructions (Signed)
Medications: Prednisone, albuterol inhaler  Treatment: Take prednisone once daily for the next 4 days.  Take albuterol inhaler every 4-6 hours as needed for shortness of breath or wheezing.    Follow-up: Please follow-up and establish care with a primary care provider by calling one of the numbers below.  They can see you without medical insurance.  Please return to the emergency department if develop any new or worsening symptoms.

## 2018-03-26 ENCOUNTER — Other Ambulatory Visit: Payer: Self-pay

## 2018-03-26 ENCOUNTER — Emergency Department (HOSPITAL_COMMUNITY)
Admission: EM | Admit: 2018-03-26 | Discharge: 2018-03-26 | Disposition: A | Discharge status: Home / Self Care | Payer: Self-pay | Attending: Emergency Medicine | Admitting: Emergency Medicine

## 2018-03-26 ENCOUNTER — Encounter (HOSPITAL_COMMUNITY): Payer: Self-pay | Admitting: Emergency Medicine

## 2018-03-26 DIAGNOSIS — Z87891 Personal history of nicotine dependence: Secondary | ICD-10-CM | POA: Insufficient documentation

## 2018-03-26 DIAGNOSIS — J9801 Acute bronchospasm: Secondary | ICD-10-CM | POA: Insufficient documentation

## 2018-03-26 DIAGNOSIS — Z79899 Other long term (current) drug therapy: Secondary | ICD-10-CM | POA: Insufficient documentation

## 2018-03-26 MED ORDER — ALBUTEROL SULFATE HFA 108 (90 BASE) MCG/ACT IN AERS
2.0000 | INHALATION_SPRAY | RESPIRATORY_TRACT | Status: DC | PRN
  Administered 2018-03-26: 2 via RESPIRATORY_TRACT
  Filled 2018-03-26: qty 6.7

## 2018-03-26 MED ORDER — ALBUTEROL SULFATE (2.5 MG/3ML) 0.083% IN NEBU
5.0000 mg | INHALATION_SOLUTION | Freq: Once | RESPIRATORY_TRACT | Status: AC
  Administered 2018-03-26: 5 mg via RESPIRATORY_TRACT
  Filled 2018-03-26: qty 6

## 2018-03-26 MED ORDER — DEXAMETHASONE SODIUM PHOSPHATE 10 MG/ML IJ SOLN
10.0000 mg | Freq: Once | INTRAMUSCULAR | Status: AC
  Administered 2018-03-26: 10 mg via INTRAMUSCULAR
  Filled 2018-03-26: qty 1

## 2018-03-26 NOTE — ED Triage Notes (Signed)
Pt report having onset of wheezing and tightness when breathing tonight. Pt reports prior hx of asthma and does not have medications.

## 2018-03-26 NOTE — ED Provider Notes (Signed)
[facility]-EMERGENCY DEPT Provider Note: Lowella DellJ. Lane Kaneesha Constantino, MD, FACEP  CSN: [id number] MRN: [id number] ARRIVAL: 03/26/18 at 0038 ROOM: WA19/WA19   CHIEF COMPLAINT  Asthma   HISTORY OF PRESENT ILLNESS  03/26/18 5:52 AM Clelia SchaumannKane M Jeune is a 22 y.o. male with a history of asthma.  He became short of breath at work yesterday evening with wheezing and chest tightness which he rated as a 5 out of 10 it is worst.  He is out of an albuterol inhaler.  He was noted to have wheezing on arrival and was given an albuterol nebulizer treatment with improvement.  He has subsequently developed additional wheezing and shortness of breath but much milder than earlier.    Past Medical History:  Diagnosis Date  . Asthma     History reviewed. No pertinent surgical history.  History reviewed. No pertinent family history.  Social History   Tobacco Use  . Smoking status: Former Smoker    Types: Cigarettes, Cigars  . Smokeless tobacco: Never Used  Substance Use Topics  . Alcohol use: Yes  . Drug use: Yes    Types: Marijuana    Comment: 2 times a week    Prior to Admission medications   Medication Sig Start Date End Date Taking? Authorizing Provider  albuterol (PROVENTIL HFA;VENTOLIN HFA) 108 (90 Base) MCG/ACT inhaler Inhale 1-2 puffs into the lungs every 6 (six) hours as needed for wheezing or shortness of breath. 12/04/17   Liberty HandyGibbons, Claudia J, PA-C  albuterol (PROVENTIL) (2.5 MG/3ML) 0.083% nebulizer solution Take 3 mLs (2.5 mg total) by nebulization every 6 (six) hours as needed for wheezing or shortness of breath. 12/04/17   Liberty HandyGibbons, Claudia J, PA-C  cetirizine (ZYRTEC ALLERGY) 10 MG tablet Take 1 tablet (10 mg total) by mouth daily. 05/15/17   Maczis, Elmer SowMichael M, PA-C  fluticasone (FLONASE) 50 MCG/ACT nasal spray Place 2 sprays into both nostrils daily. Patient not taking: Reported on 03/07/2018 05/15/17   Maczis, Elmer SowMichael M, PA-C  predniSONE (DELTASONE) 20 MG tablet Take 3 tablets (60 mg total) by mouth daily.  03/07/18   Emi HolesLaw, Alexandra M, PA-C    Allergies Patient has no known allergies.   REVIEW OF SYSTEMS  Negative except as noted here or in the History of Present Illness.   PHYSICAL EXAMINATION  Initial Vital Signs Blood pressure 135/75, pulse 76, temperature 98.3 F (36.8 C), temperature source Oral, resp. rate 13, height 5\' 5"  (1.651 m), weight 97.5 kg (215 lb), SpO2 100 %.  Examination General: Well-developed, well-nourished male in no acute distress; appearance consistent with age of record HENT: normocephalic; atraumatic Eyes: pupils equal, round and reactive to light; extraocular muscles intact Neck: supple Heart: regular rate and rhythm Lungs: Faint inspiratory and expiratory wheezes bilaterally Abdomen: soft; nondistended; nontender; bowel sounds present Extremities: No deformity; full range of motion; pulses normal Neurologic: Awake, alert and oriented; motor function intact in all extremities and symmetric; no facial droop Skin: Warm and dry Psychiatric: Normal mood and affect   RESULTS  Summary of this visit's results, reviewed by myself:   EKG Interpretation  Date/Time:  Monday March 26 2018 00:44:59 EDT Ventricular Rate:  78 PR Interval:    QRS Duration: 95 QT Interval:  385 QTC Calculation: 439 R Axis:   116 Text Interpretation:  Sinus rhythm Consider right ventricular hypertrophy Borderline T abnormalities, inferior leads No significant change was found Confirmed by Paula LibraMolpus, Barkley Kratochvil ([id number]) on 03/26/2018 12:54:48 AM      Laboratory Studies: No results found for this or  any previous visit (from the past 24 hour(s)). Imaging Studies: No results found.  ED COURSE and MDM  Nursing notes and initial vitals signs, including pulse oximetry, reviewed.  Vitals:   03/26/18 0044 03/26/18 0045 03/26/18 0552  BP: 135/75  136/85  Pulse: 76  61  Resp: 13  18  Temp: 98.3 F (36.8 C)    TempSrc: Oral    SpO2: 100%  98%  Weight:  97.5 kg (215 lb)   Height:  5\' 5"   (1.651 m)    We will refill patient's inhaler and give him a dose of steroids.  He was encouraged to find a primary care physician as he has had multiple visits to the ED for the same complaint.  PROCEDURES    ED DIAGNOSES     ICD-10-CM   1. Acute bronchospasm J98.01        Aryelle Figg, Jonny Ruiz, MD 03/26/18 [phone]

## 2018-04-17 ENCOUNTER — Encounter (HOSPITAL_COMMUNITY): Payer: Self-pay | Admitting: Emergency Medicine

## 2018-04-17 ENCOUNTER — Emergency Department (HOSPITAL_COMMUNITY)
Admission: EM | Admit: 2018-04-17 | Discharge: 2018-04-17 | Disposition: A | Discharge status: Home / Self Care | Payer: Self-pay | Attending: Emergency Medicine | Admitting: Emergency Medicine

## 2018-04-17 ENCOUNTER — Other Ambulatory Visit: Payer: Self-pay

## 2018-04-17 DIAGNOSIS — Z87891 Personal history of nicotine dependence: Secondary | ICD-10-CM | POA: Insufficient documentation

## 2018-04-17 DIAGNOSIS — J4541 Moderate persistent asthma with (acute) exacerbation: Secondary | ICD-10-CM | POA: Insufficient documentation

## 2018-04-17 MED ORDER — ALBUTEROL SULFATE HFA 108 (90 BASE) MCG/ACT IN AERS
2.0000 | INHALATION_SPRAY | RESPIRATORY_TRACT | Status: DC
  Administered 2018-04-17: 2 via RESPIRATORY_TRACT
  Filled 2018-04-17: qty 6.7

## 2018-04-17 MED ORDER — IPRATROPIUM-ALBUTEROL 0.5-2.5 (3) MG/3ML IN SOLN
3.0000 mL | Freq: Once | RESPIRATORY_TRACT | Status: AC
  Administered 2018-04-17: 3 mL via RESPIRATORY_TRACT
  Filled 2018-04-17: qty 3

## 2018-04-17 MED ORDER — ALBUTEROL SULFATE (2.5 MG/3ML) 0.083% IN NEBU
5.0000 mg | INHALATION_SOLUTION | Freq: Once | RESPIRATORY_TRACT | Status: AC
  Administered 2018-04-17: 5 mg via RESPIRATORY_TRACT
  Filled 2018-04-17: qty 6

## 2018-04-17 MED ORDER — MOMETASONE FURO-FORMOTEROL FUM 100-5 MCG/ACT IN AERO
2.0000 | INHALATION_SPRAY | Freq: Two times a day (BID) | RESPIRATORY_TRACT | Status: DC
  Administered 2018-04-17: 2 via RESPIRATORY_TRACT
  Filled 2018-04-17: qty 8.8

## 2018-04-17 NOTE — ED Notes (Signed)
Pt ambulated down hallway and back on PulseOx. Pt's O2 slowly dropped from 99 to 89, then rose up to 92-93%, where it stayed until he sat down again. He is now at 97%. Gait was normal, no balance or strength issues noted. No SOB. Pt stated he felt fine. RN notified.

## 2018-04-17 NOTE — ED Triage Notes (Signed)
Patient to ED c/o asthma flare up, hasn't had inhaler in 2 weeks. Reports difficulty breathing at night. Denies SOB or CP. Resp e/u, skin warm/dry.

## 2018-04-17 NOTE — ED Provider Notes (Signed)
[facility] EMERGENCY DEPARTMENT Provider Note   CSN: [id number] Arrival date & time: 04/17/18  4098     History   Chief Complaint Chief Complaint  Patient presents with  . Asthma    HPI Joshua Roach is a 22 y.o. male with a history of asthma presenting with chest tightness for the last 2 days.  He reports associated dyspnea with exertion that improves with rest and nonproductive cough that is worse at night.  He reports that he is woken up several times over the last few nights due to the cough.  He reports a long-standing history of asthma since childhood.  He was previously on an Advair inhaler when he was a teenager, but has not had the medication in several years as he has not had medical insurance.  Of note, the patient has been seen in the ED 8 times over the last 6 months similar symptoms.  He reports that he was given an inhaler and a course of steroids during his last ED visit on 03/26/2018.  He reports that over the last few weeks that he is completely used the entire inhaler due to frequency of his symptoms.  He reports that he has a nebulizer machine at home, but is unable to afford the albuterol vials until his paycheck later this week.  States that his symptoms feel like his usual asthma flare.  States that his symptoms are triggered by changes with the season.  He reports that during a typical week that he has asthma related symptoms 3 to 4 days of the week.  He reports that he has previously been hospitalized for his asthma.  No ICU admissions or intubations.  He has a spacer at home.  He denies fever, chills, palpitations, chest pain, back pain, nausea, vomiting, diarrhea.  No recent long travel.  No pleuritic chest pain.  The history is provided by the patient. No language interpreter was used.    Past Medical History:  Diagnosis Date  . Asthma     There are no active problems to display for this patient.   History reviewed. No pertinent  surgical history.      Home Medications    Prior to Admission medications   Not on File    Family History No family history on file.  Social History Social History   Tobacco Use  . Smoking status: Former Smoker    Types: Cigarettes, Cigars  . Smokeless tobacco: Never Used  Substance Use Topics  . Alcohol use: Yes  . Drug use: Yes    Types: Marijuana    Comment: 2 times a week     Allergies   Patient has no known allergies.   Review of Systems Review of Systems  Constitutional: Negative for appetite change, chills and fever.  HENT: Negative for congestion and sore throat.   Respiratory: Positive for cough, chest tightness, shortness of breath and wheezing.   Cardiovascular: Negative for chest pain.  Gastrointestinal: Negative for abdominal pain.  Genitourinary: Negative for dysuria.  Musculoskeletal: Negative for back pain.  Skin: Negative for rash.  Allergic/Immunologic: Negative for immunocompromised state.  Neurological: Negative for headaches.  Psychiatric/Behavioral: Negative for confusion.   Physical Exam Updated Vital Signs BP 130/71 (BP Location: Right Arm)   Pulse (!) 56   Temp 98.3 F (36.8 C) (Oral)   Resp 15   SpO2 99%   Physical Exam  Constitutional: He appears well-developed.  HENT:  Head: Normocephalic.  Eyes: Conjunctivae are normal.  Neck: Neck supple.  Cardiovascular: Normal rate, regular rhythm, normal heart sounds and intact distal pulses. Exam reveals no gallop and no friction rub.  No murmur heard. Pulmonary/Chest: Effort normal. No stridor. No respiratory distress. He has wheezes. He has no rales. He exhibits no tenderness.  Expiratory wheezes in all fields bilaterally.  No rhonchi or rales.  Good effort with inspiration and expiration.  Symmetric chest expansion with inspiration.  No respiratory distress.  Speaks in complete sentences without dyspnea.  Abdominal: Soft. He exhibits no distension.  Neurological: He is alert.    Skin: Skin is warm and dry.  Psychiatric: His behavior is normal.  Nursing note and vitals reviewed.  ED Treatments / Results  Labs (all labs ordered are listed, but only abnormal results are displayed) Labs Reviewed - No data to display  EKG None  Radiology No results found.  Procedures Procedures (including critical care time)  Medications Ordered in ED Medications  mometasone-formoterol (DULERA) 100-5 MCG/ACT inhaler 2 puff (2 puffs Inhalation Provided for home use 04/17/18 1117)  albuterol (PROVENTIL HFA;VENTOLIN HFA) 108 (90 Base) MCG/ACT inhaler 2 puff (2 puffs Inhalation Provided for home use 04/17/18 1009)  ipratropium-albuterol (DUONEB) 0.5-2.5 (3) MG/3ML nebulizer solution 3 mL (3 mLs Nebulization Given 04/17/18 1009)  albuterol (PROVENTIL) (2.5 MG/3ML) 0.083% nebulizer solution 5 mg (5 mg Nebulization Given 04/17/18 1141)     Initial Impression / Assessment and Plan / ED Course  I have reviewed the triage vital signs and the nursing notes.  Pertinent labs & imaging results that were available during my care of the patient were reviewed by me and considered in my medical decision making (see chart for details).  22 year old male with a history of asthma presenting with chest tightness, intermittent dyspnea with exertion, and nonproductive cough that have gradually worsened over the last 2 days.  He is out of his home albuterol.  He reports that he was previously on Advair as a teenager, but has not had medical insurance in some time.  He has been treating his symptoms for months with albuterol rescue inhalers.  Of note, the patient has been seen 8 times over the last 6 months for similar symptoms.  His last visit was 03/26/2018 and over the last 3 weeks the patient has used the entire albuterol inhaler that he was given at that visit.  Given the frequency of his symptoms, the patient meets criteria for a LABA/ICH.  Will provide the patient with a Dulera inhaler for  maintenance and refill his albuterol inhaler.  He recently completed a course of steroids and his exacerbation today is very mild.  DuoNeb given in the ED.  On reevaluation lungs are clear to auscultation bilaterally.   Clinical Course as of Apr 17 1306  Tue Apr 17, 2018  1130 Nursing staff reports SaO2 dropped to 89 to 90% with ambulation.  Will repeat albuterol treatment.   [MM]    Clinical Course User Index [MM] Cisco Kindt A, PA-C   After repeat albuterol inhaler treatment lungs remain clear to auscultation.  The patient was re-ambulated and SaO2 remained at 100%.  Spoke with Camelia with social work who has scheduled the patient for an appointment at [facility] on September 10 at 1030.  Strongly encourage the patient to keep this appointment.  Strict return precautions given.  He is hemodynamically stable and in no acute distress.  He is safe for discharge home with outpatient follow-up at this time.   Final Clinical Impressions(s) /  ED Diagnoses   Final diagnoses:  Moderate persistent asthma with acute exacerbation    ED Discharge Orders    None       Barkley BoardsMcDonald, Narcissa Melder A, PA-C 04/17/18 1307    Rolan BuccoBelfi, Melanie, MD 04/17/18 1526

## 2018-04-17 NOTE — Discharge Instructions (Addendum)
Thank you for allowing me to care for you today in the Emergency Department.   You have a follow-up appointment at [facility] on September 10 at 10:30 AM.  The address is above.  If you are unable to make this appointment, please call the office so that they can open the appointment up for another patient.  I strongly encourage you to keep this appointment so they can reassess your symptoms at this time.  [facility] also has a pharmacy or you can get your prescriptions filled.  Take 2 puffs of the Dulera inhaler every morning and night with your spacer.  Use 2 puffs of the albuterol inhaler with your spacer every 4 hours as needed for shortness of breath or wheezing.  Return to the emergency department if you develop persistent wheezing despite using her albuterol, shortness of breath, chest tightness, feeling as if your throat is closing, or other new, concerning symptoms.

## 2018-04-17 NOTE — ED Notes (Signed)
Pt walked again only getting below 100 when he got  Back to room

## 2018-04-21 ENCOUNTER — Emergency Department (HOSPITAL_COMMUNITY)
Admission: EM | Admit: 2018-04-21 | Discharge: 2018-04-21 | Disposition: A | Discharge status: Home / Self Care | Payer: Self-pay | Attending: Emergency Medicine | Admitting: Emergency Medicine

## 2018-04-21 ENCOUNTER — Encounter (HOSPITAL_COMMUNITY): Payer: Self-pay | Admitting: Emergency Medicine

## 2018-04-21 DIAGNOSIS — Z87891 Personal history of nicotine dependence: Secondary | ICD-10-CM | POA: Insufficient documentation

## 2018-04-21 DIAGNOSIS — J020 Streptococcal pharyngitis: Secondary | ICD-10-CM | POA: Insufficient documentation

## 2018-04-21 DIAGNOSIS — J45909 Unspecified asthma, uncomplicated: Secondary | ICD-10-CM | POA: Insufficient documentation

## 2018-04-21 LAB — GROUP A STREP BY PCR: GROUP A STREP BY PCR: DETECTED — AB

## 2018-04-21 MED ORDER — PENICILLIN G BENZATHINE 1200000 UNIT/2ML IM SUSP
1.2000 10*6.[IU] | Freq: Once | INTRAMUSCULAR | Status: AC
  Administered 2018-04-21: 1.2 10*6.[IU] via INTRAMUSCULAR
  Filled 2018-04-21: qty 2

## 2018-04-21 MED ORDER — ACETAMINOPHEN 500 MG PO TABS
500.0000 mg | ORAL_TABLET | Freq: Once | ORAL | Status: AC
  Administered 2018-04-21: 500 mg via ORAL
  Filled 2018-04-21: qty 1

## 2018-04-21 NOTE — ED Triage Notes (Signed)
Patient here from home with complaints of sore throat x3 days. OTC medications with no relief.

## 2018-04-21 NOTE — Discharge Instructions (Addendum)
Please return to the Emergency Department for any new or worsening symptoms or if your symptoms do not improve. Please be sure to follow up with your Primary Care Physician as soon as possible regarding your visit today. If you do not have a Primary Doctor please use the resources below to establish one. Please take Ibuprofen (Advil, motrin) and Tylenol (acetaminophen) to relieve your pain.  You may take up to 400 MG (2 pills) of normal strength ibuprofen every 8 hours as needed.  In between doses of ibuprofen you make take tylenol, up to 500 mg (one extra strength pills).  Do not take more than 3,000 mg tylenol in a 24 hour period.  Please check all medication labels as many medications such as pain and cold medications may contain tylenol.  Do not drink alcohol while taking these medications.  Do not take other NSAID'S while taking ibuprofen (such as aleve or naproxen).  Please take ibuprofen with food to decrease stomach upset. Please be sure to drink plenty of water to avoid dehydration.  Contact a health care provider if: You have a fever for more than 2-3 days. You have symptoms that last (are persistent) for more than 2-3 days. Your throat does not get better within 7 days. You have a fever and your symptoms suddenly get worse. Get help right away if: You have difficulty breathing. You cannot swallow fluids, soft foods, or your saliva. You have increased swelling in your throat or neck. You have persistent nausea and vomiting.  RESOURCE GUIDE  Chronic Pain Problems: Contact [facility]  [phone] Patients need to be referred by their primary care doctor.  Insufficient Money for Medicine: Contact United Way:  call "211" or [facility] [phone].  No Primary Care Doctor: Call Health Connect  [phone] - can help you locate a primary care doctor that  accepts your insurance, provides certain services, etc. Physician Referral Service-  [phone]  Agencies that provide inexpensive medical care: [facility]  [phone] [facility]  [phone] [facility]  [phone] [facility]  [phone] [facility]  [phone] [facility]  [phone]  Medicaid-accepting [facility] Providers: [facility]- [address], [address]  [phone], Mon-Fri 9am-7pm, Sat 9am-1pm [facility]- [address], [address] [place]  [phone] [facility]- [address], [address] [place]  [phone] [facility]- [address]  [phone] Renaye RakersVeita Bland- [address], [address], [phone]  Only accepts [place] patients after they have their name  applied to their card  Self Pay (no insurance) in [facility]: Sickle Cell Patients: Dr Willey BladeEric Dean, [facility]  [address], [phone] [facility]- [address]  [phone]       Redge Gainer-     [facility]- [address], [address]       -     [facility]- see information above (Speak to CitigroupPam H if you do not have insurance)       -  [facility]- [address], [phone]       -  [facility]- [address],  [phone]       -  [facility]- [address], [phone]       -  Dr Julio Sickssei-Bonsu-  [phone],  [address], [place], [phone]       -  [facility]- [address] [phone]       -  [facility]- [address] [phone], also [address] [phone]       -    [facility]- [address] [place], [phone], 1st & 3rd Saturday   every month, 10am-1pm  1) Find a Doctor and Pay Out of Pocket Although you won't have to find out who is covered by your insurance plan, it is a good idea to ask around and get recommendations. You will then need to call the office and see if the doctor you  have chosen will accept you as a new patient and what types of options they offer for patients who are self-pay. Some doctors offer discounts or will set up payment plans for their patients who do not have insurance, but you will need to ask so you aren't surprised when you get to your appointment.  2) Contact Your Local Health Department Not all health departments have doctors that can see patients for sick visits, but many do, so it is worth a call to see if yours does. If you don't know where your local health department is, you can check in your phone book. The CDC also has a tool to help you locate your state's health department, and many state websites also have listings of all of their local health departments.  3) Find a Walk-in Clinic If your illness is not likely to be very severe or complicated, you may want to try a walk in clinic. These are popping up all over the country in pharmacies, drugstores, and shopping centers. They're usually staffed by nurse practitioners or physician assistants that have been trained to treat common illnesses and complaints. They're usually fairly quick and inexpensive. However, if you have serious medical issues or chronic medical problems, these are probably not your best option  STD Testing [facility] Department of [facility], STD Clinic, [address] [place], phone [phone] or [phone].  Monday - Friday, call for an appointment. [facility] Department of [employer], STD Clinic, [address], [place], phone [phone] or [phone].  Monday - Friday, call for an appointment.  Abuse/Neglect: [facility] Child Abuse Hotline 613-244-4414 [facility] Child Abuse Hotline (657) 184-7347 (After Hours)  Emergency Shelter:  [facility] 340-026-8240  Maternity Homes: [facility] at [facility] 425-110-0977 [facility] [phone]  MRSA Hotline #:    5858668499  [facility] Resources  [facility] [place]  United [employer] [facility] Dept. [address]                 [address]         [address]  [place]                                               Cristobal Goldmann Phone:  [phone]  Phone:  [phone]                   Phone:  [place], [place]- [phone]       -     [facility] in [place], [address]                                  [place] [phone] or [phone] (After Hours)   [place]  Substance Abuse Resources: Alcohol and Drug Services  360-205-1511 [place] 2504862001 [facility] [place]a [address] 501 425 3739 Residential & Outpatient Substance Abuse Program  579-007-3980  Psychological Services: [place]  [phone] [place]  [place], [place]. [address] [place], [place]: [phone] or [phone], PicCapture.uy  Dental Assistance  If unable to pay or uninsured, contact:  [facility] Serve or [facility]. to become qualified for the adult dental clinic.  Patients with Medicaid: [facility] [phone] [address], [place] [address], [phone]  If unable to pay, or uninsured, contact [facility] [phone]) or Cisco 6235274445 in [place], [place] in [facility]) to become qualified for the adult dental clinic   Other [place]- [place], [place], [place], [address], [place], Start, 2nd and 4th Thursday of the month at 6:30am.  10 clients each day  by appointment, can sometimes see walk-in patients if someone does not show for an appointment. [facility]- [address] Hillard Danker [place], [place], [address], [place], [place], [place], [address], [place] Department- [phone] [place] [facility] [phone]

## 2018-04-21 NOTE — ED Provider Notes (Signed)
[facility]-EMERGENCY DEPT Provider Note   CSN: [id number] Arrival date & time: 04/21/18  1216     History   Chief Complaint Chief Complaint  Patient presents with  . Sore Throat    HPI Joshua Roach is a 22 y.o. male presenting for 3 days of sore throat.  Patient describes a sore throat as a burning/sharp pain that is 4/10 in severity worse with swallowing.  Patient states that he is taken over-the-counter ibuprofen without relief.  Patient states that his girlfriend was recently sick with similar symptoms and he feels that he may have got it from her.  Patient denies drooling, voice change, inability to swallow fluids/solids, fever, nausea/vomiting, abdominal pain.  Patient states he has still been eating and drinking at home.  HPI  Past Medical History:  Diagnosis Date  . Asthma     There are no active problems to display for this patient.   History reviewed. No pertinent surgical history.      Home Medications    Prior to Admission medications   Medication Sig Start Date End Date Taking? Authorizing Provider  albuterol (PROVENTIL HFA;VENTOLIN HFA) 108 (90 Base) MCG/ACT inhaler Inhale 2 puffs into the lungs every 6 (six) hours as needed for wheezing or shortness of breath.   Yes [provider]    Family History No family history on file.  Social History Social History   Tobacco Use  . Smoking status: Former Smoker    Types: Cigarettes, Cigars  . Smokeless tobacco: Never Used  Substance Use Topics  . Alcohol use: Yes  . Drug use: Yes    Types: Marijuana    Comment: 2 times a week     Allergies   Patient has no known allergies.   Review of Systems Review of Systems  Constitutional: Negative.  Negative for chills and fever.  HENT: Positive for rhinorrhea and sore throat. Negative for facial swelling and voice change.   Respiratory: Negative.  Negative for cough, choking and shortness of breath.   Skin: Negative.   Negative for rash.     Physical Exam Updated Vital Signs BP 136/76 (BP Location: Left Arm)   Pulse 71   Temp 98.7 F (37.1 C) (Oral)   Resp 18   SpO2 100%   Physical Exam  Constitutional: He is oriented to person, place, and time. He appears well-developed and well-nourished. He does not appear ill. No distress.  HENT:  Head: Normocephalic and atraumatic.  Right Ear: Hearing and external ear normal.  Left Ear: Hearing and external ear normal.  Nose: Nose normal.  Mouth/Throat: Uvula is midline and mucous membranes are normal. No trismus in the jaw. No uvula swelling. No oropharyngeal exudate, posterior oropharyngeal edema, posterior oropharyngeal erythema or tonsillar abscesses. Tonsils are 2+ on the right. Tonsils are 2+ on the left. No tonsillar exudate.  Tonsils symmetrically enlarged.  No uvula deviation. No sign of peritonsillar abscess at this time. No sign of Ludwig's angina. No sign of retropharyngeal abscess.  Eyes: Pupils are equal, round, and reactive to light. EOM are normal.  Neck: Trachea normal and normal range of motion. Neck supple. No tracheal deviation present.  Pulmonary/Chest: Effort normal. No stridor. No respiratory distress.  Abdominal: Soft. There is no tenderness. There is no rebound and no guarding.  Musculoskeletal: Normal range of motion.  Lymphadenopathy:    He has cervical adenopathy.  Neurological: He is alert and oriented to person, place, and time.  Skin: Skin is warm and  dry. Capillary refill takes less than 2 seconds.  Psychiatric: He has a normal mood and affect. His behavior is normal.     ED Treatments / Results  Labs (all labs ordered are listed, but only abnormal results are displayed) Labs Reviewed  GROUP A STREP BY PCR - Abnormal; Notable for the following components:      Result Value   Group A Strep by PCR DETECTED (*)    All other components within normal limits    EKG None  Radiology No results  found.  Procedures Procedures (including critical care time)  Medications Ordered in ED Medications  acetaminophen (TYLENOL) tablet 500 mg (500 mg Oral Given 04/21/18 1354)  penicillin g benzathine (BICILLIN LA) 1200000 UNIT/2ML injection 1.2 Million Units (1.2 Million Units Intramuscular Given 04/21/18 1355)     Initial Impression / Assessment and Plan / ED Course  I have reviewed the triage vital signs and the nursing notes.  Pertinent labs & imaging results that were available during my care of the patient were reviewed by me and considered in my medical decision making (see chart for details).   Patient presenting for 3 days of sore throat. Vision is afebrile and without tonsillar exudate.  However with cervical lymphadenopathy on the right side, dysphagia. Positive group A strep by PCR. Patient treated with 1,200,000 units of pen G and 500 mg of PO Tylenol. Patient does not appear dehydrated however we did discuss the importance of water rehydration.  Presentation today not concerning for peritonsillar abscess, retropharyngeal abscess or Ludwig's angina.  There is no trismus or uvular deviation.  Airway is intact and patient is able to drink water in the emergency department.  I have recommended PCP follow-up and given multiple resources for patient to establish a primary care provider. Patient is afebrile, not tachycardic not hypotensive well-appearing able to swallow fluids without difficulty in emergency department.  Patient ambulatory, no acute distress. Patient encouraged to use over-the-counter anti-inflammatories as directed on the packaging for pain.  At this time there does not appear to be any evidence of an acute emergency medical condition and the patient appears stable for discharge with appropriate outpatient follow up. Diagnosis was discussed with patient who verbalizes understanding of care plan and is agreeable to discharge. I have discussed return precautions with  patient who verbalizes understanding of return precautions. Patient strongly encouraged to follow-up with their PCP. All questions answered.   Note: Portions of this report may have been transcribed using voice recognition software. Every effort was made to ensure accuracy; however, inadvertent computerized transcription errors may still be present.   Final Clinical Impressions(s) / ED Diagnoses   Final diagnoses:  Strep pharyngitis    ED Discharge Orders    None       Elizabeth Palau 04/21/18 2204    Mesner, Barbara Cower, MD 04/22/18 [phone]

## 2018-04-21 NOTE — ED Notes (Signed)
Bed: WTR6 Expected date:  Expected time:  Means of arrival:  Comments: 

## 2018-05-08 ENCOUNTER — Inpatient Hospital Stay: Payer: Self-pay | Admitting: Family Medicine

## 2018-05-10 ENCOUNTER — Emergency Department (HOSPITAL_COMMUNITY): Payer: Self-pay

## 2018-05-10 ENCOUNTER — Encounter (HOSPITAL_COMMUNITY): Payer: Self-pay | Admitting: *Deleted

## 2018-05-10 ENCOUNTER — Other Ambulatory Visit: Payer: Self-pay

## 2018-05-10 ENCOUNTER — Emergency Department (HOSPITAL_COMMUNITY)
Admission: EM | Admit: 2018-05-10 | Discharge: 2018-05-10 | Disposition: A | Discharge status: Home / Self Care | Payer: Self-pay | Attending: Emergency Medicine | Admitting: Emergency Medicine

## 2018-05-10 DIAGNOSIS — Z87891 Personal history of nicotine dependence: Secondary | ICD-10-CM | POA: Insufficient documentation

## 2018-05-10 DIAGNOSIS — J4521 Mild intermittent asthma with (acute) exacerbation: Secondary | ICD-10-CM | POA: Insufficient documentation

## 2018-05-10 MED ORDER — PREDNISONE 10 MG PO TABS: 20.0000 mg | ORAL_TABLET | Freq: Every day | ORAL | 0 refills | Status: DC

## 2018-05-10 MED ORDER — ALBUTEROL SULFATE HFA 108 (90 BASE) MCG/ACT IN AERS
2.0000 | INHALATION_SPRAY | RESPIRATORY_TRACT | Status: DC | PRN
  Administered 2018-05-10: 2 via RESPIRATORY_TRACT
  Filled 2018-05-10: qty 6.7

## 2018-05-10 MED ORDER — ALBUTEROL SULFATE (2.5 MG/3ML) 0.083% IN NEBU
5.0000 mg | INHALATION_SOLUTION | Freq: Once | RESPIRATORY_TRACT | Status: AC
  Administered 2018-05-10: 5 mg via RESPIRATORY_TRACT
  Filled 2018-05-10: qty 6

## 2018-05-10 MED ORDER — IPRATROPIUM-ALBUTEROL 0.5-2.5 (3) MG/3ML IN SOLN
3.0000 mL | Freq: Once | RESPIRATORY_TRACT | Status: AC
  Administered 2018-05-10: 3 mL via RESPIRATORY_TRACT
  Filled 2018-05-10: qty 3

## 2018-05-10 MED ORDER — PREDNISONE 20 MG PO TABS
40.0000 mg | ORAL_TABLET | Freq: Once | ORAL | Status: AC
  Administered 2018-05-10: 40 mg via ORAL
  Filled 2018-05-10: qty 2

## 2018-05-10 NOTE — ED Provider Notes (Signed)
[facility]-EMERGENCY DEPT Provider Note   CSN: [id number] Arrival date & time: 05/10/18  1757     History   Chief Complaint Chief Complaint  Patient presents with  . Cough  . Wheezing    HPI Joshua Roach is a 22 y.o. male who presents to the ED for cough and wheezing. Patient reports hx of asthma and states he is out of his inhaler. Patient reports that this morning he was coughing up mucous and thought maybe it was more than asthma. Patient denies chest pain and reports only mild shortness of breath.   HPI  Past Medical History:  Diagnosis Date  . Asthma     There are no active problems to display for this patient.   History reviewed. No pertinent surgical history.      Home Medications    Prior to Admission medications   Medication Sig Start Date End Date Taking? Authorizing Provider  albuterol (PROVENTIL HFA;VENTOLIN HFA) 108 (90 Base) MCG/ACT inhaler Inhale 2 puffs into the lungs every 6 (six) hours as needed for wheezing or shortness of breath.    [provider]  predniSONE (DELTASONE) 10 MG tablet Take 2 tablets (20 mg total) by mouth daily with breakfast. 05/10/18   Janne NapoleonNeese, Easten Maceachern M, NP    Family History No family history on file.  Social History Social History   Tobacco Use  . Smoking status: Former Smoker    Types: Cigarettes, Cigars  . Smokeless tobacco: Never Used  Substance Use Topics  . Alcohol use: Yes  . Drug use: Yes    Types: Marijuana    Comment: 2 times a week     Allergies   Patient has no known allergies.   Review of Systems Review of Systems  Constitutional: Negative for chills and fever.  HENT: Negative.   Eyes: Negative for visual disturbance.  Respiratory: Positive for cough and wheezing. Shortness of breath: mild.   Gastrointestinal: Negative for abdominal pain, nausea and vomiting.  Musculoskeletal: Negative for neck stiffness.  Skin: Negative for rash.  Neurological: Negative for  headaches.  Psychiatric/Behavioral: Negative for confusion.     Physical Exam Updated Vital Signs BP (!) 149/82   Pulse 80   Temp 98.2 F (36.8 C) (Oral)   Resp 18   SpO2 96%   Physical Exam  Constitutional: He is oriented to person, place, and time. He appears well-developed and well-nourished. No distress.  HENT:  Head: Normocephalic and atraumatic.  Eyes: EOM are normal.  Neck: Neck supple.  Cardiovascular: Normal rate and regular rhythm.  Pulmonary/Chest: Effort normal. He has decreased breath sounds. He has wheezes.  Inspiratory and expiratory wheezes bilateral.   Abdominal: Soft. There is no tenderness.  Musculoskeletal: Normal range of motion.  Neurological: He is alert and oriented to person, place, and time. No cranial nerve deficit.  Skin: Skin is warm and dry.  Psychiatric: He has a normal mood and affect. His behavior is normal.  Nursing note and vitals reviewed.    ED Treatments / Results  Labs (all labs ordered are listed, but only abnormal results are displayed) Labs Reviewed - No data to display  Radiology Dg Chest 2 View  Result Date: 05/10/2018 CLINICAL DATA:  Woke up this morning coughing up mucus, mild shortness of breath, history asthma, former smoker EXAM: CHEST - 2 VIEW COMPARISON:  01/21/2018 FINDINGS: Normal heart size, mediastinal contours, and pulmonary vascularity. Lungs clear. No pleural effusion or pneumothorax. Bones unremarkable. IMPRESSION: Normal exam. Electronically Signed  By: Ulyses Southward M.D.   On: 05/10/2018 19:20    Procedures Procedures (including critical care time)  Medications Ordered in ED Medications  albuterol (PROVENTIL HFA;VENTOLIN HFA) 108 (90 Base) MCG/ACT inhaler 2 puff (2 puffs Inhalation Given 05/10/18 2117)  albuterol (PROVENTIL) (2.5 MG/3ML) 0.083% nebulizer solution 5 mg (5 mg Nebulization Given 05/10/18 1822)  ipratropium-albuterol (DUONEB) 0.5-2.5 (3) MG/3ML nebulizer solution 3 mL (3 mLs Nebulization Given  05/10/18 1847)  predniSONE (DELTASONE) tablet 40 mg (40 mg Oral Given 05/10/18 1847)  albuterol (PROVENTIL) (2.5 MG/3ML) 0.083% nebulizer solution 5 mg (5 mg Nebulization Given 05/10/18 2025)   After first breathing treatment of albuterol 2.5 mg patient continues to have wheezing. Patient given Duoneb and wheezing decreased and air movement improved. Third treatment with Albuterol 5 mg there is good air movement and less wheezing. Patient stable for d/c with O2 SAT 96% on R/A and not shortness of breath with ambulation. Albuterol inhaler given prior to d/c, prednisone and Rx for prednisone. Patient will f/u with [facility] for management of his asthma and rechek of BP.   Initial Impression / Assessment and Plan / ED Course  I have reviewed the triage vital signs and the nursing notes.   Final Clinical Impressions(s) / ED Diagnoses   Final diagnoses:  Mild intermittent asthma with exacerbation    ED Discharge Orders         Ordered    predniSONE (DELTASONE) 10 MG tablet  Daily with breakfast     05/10/18 2102           Kerrie Buffalo Wallace, NP 05/10/18 2132    Linwood Dibbles, MD 05/10/18 2357

## 2018-05-10 NOTE — Discharge Instructions (Addendum)
Call [facility] for a follow up appointment for management of your asthma and blood pressure recheck

## 2018-05-10 NOTE — ED Triage Notes (Signed)
Pt reports waking up this am coughing up mucus.  Mild SOB, denies any cp.  He has hx of asthma but ran out of inhaler.  Wheezing auscultated throughout.

## 2018-05-24 ENCOUNTER — Encounter (HOSPITAL_COMMUNITY): Payer: Self-pay

## 2018-05-24 ENCOUNTER — Other Ambulatory Visit: Payer: Self-pay

## 2018-05-24 ENCOUNTER — Emergency Department (HOSPITAL_COMMUNITY)
Admission: EM | Admit: 2018-05-24 | Discharge: 2018-05-24 | Disposition: A | Discharge status: Home / Self Care | Payer: Self-pay | Attending: Emergency Medicine | Admitting: Emergency Medicine

## 2018-05-24 ENCOUNTER — Emergency Department (HOSPITAL_COMMUNITY): Payer: Self-pay

## 2018-05-24 DIAGNOSIS — R05 Cough: Secondary | ICD-10-CM | POA: Insufficient documentation

## 2018-05-24 DIAGNOSIS — J45901 Unspecified asthma with (acute) exacerbation: Secondary | ICD-10-CM | POA: Insufficient documentation

## 2018-05-24 DIAGNOSIS — Z87891 Personal history of nicotine dependence: Secondary | ICD-10-CM | POA: Insufficient documentation

## 2018-05-24 DIAGNOSIS — F1290 Cannabis use, unspecified, uncomplicated: Secondary | ICD-10-CM | POA: Insufficient documentation

## 2018-05-24 DIAGNOSIS — J069 Acute upper respiratory infection, unspecified: Secondary | ICD-10-CM | POA: Insufficient documentation

## 2018-05-24 DIAGNOSIS — R062 Wheezing: Secondary | ICD-10-CM | POA: Insufficient documentation

## 2018-05-24 DIAGNOSIS — B9789 Other viral agents as the cause of diseases classified elsewhere: Secondary | ICD-10-CM

## 2018-05-24 MED ORDER — GUAIFENESIN 100 MG/5ML PO SOLN
10.0000 mL | Freq: Once | ORAL | Status: AC
  Administered 2018-05-24: 200 mg via ORAL
  Filled 2018-05-24: qty 10

## 2018-05-24 MED ORDER — PREDNISONE 20 MG PO TABS
60.0000 mg | ORAL_TABLET | Freq: Once | ORAL | Status: AC
  Administered 2018-05-24: 60 mg via ORAL
  Filled 2018-05-24: qty 3

## 2018-05-24 MED ORDER — ALBUTEROL SULFATE HFA 108 (90 BASE) MCG/ACT IN AERS
2.0000 | INHALATION_SPRAY | Freq: Once | RESPIRATORY_TRACT | Status: AC
  Administered 2018-05-24: 2 via RESPIRATORY_TRACT
  Filled 2018-05-24: qty 6.7

## 2018-05-24 MED ORDER — IPRATROPIUM-ALBUTEROL 0.5-2.5 (3) MG/3ML IN SOLN
3.0000 mL | Freq: Once | RESPIRATORY_TRACT | Status: AC
  Administered 2018-05-24: 3 mL via RESPIRATORY_TRACT
  Filled 2018-05-24: qty 3

## 2018-05-24 MED ORDER — AEROCHAMBER PLUS FLO-VU MISC
1.0000 | Freq: Once | Status: AC
  Administered 2018-05-24: 1
  Filled 2018-05-24: qty 1

## 2018-05-24 MED ORDER — ALBUTEROL SULFATE (2.5 MG/3ML) 0.083% IN NEBU
5.0000 mg | INHALATION_SOLUTION | Freq: Once | RESPIRATORY_TRACT | Status: AC
  Administered 2018-05-24: 5 mg via RESPIRATORY_TRACT
  Filled 2018-05-24: qty 6

## 2018-05-24 MED ORDER — PREDNISONE 50 MG PO TABS: 50.0000 mg | ORAL_TABLET | Freq: Every day | ORAL | 0 refills | Status: AC

## 2018-05-24 NOTE — ED Provider Notes (Signed)
[facility]-EMERGENCY DEPT Provider Note   CSN: [id number] Arrival date & time: 05/24/18  1740     History   Chief Complaint Chief Complaint  Patient presents with  . Asthma  . Cough    HPI Joshua Roach is a 22 y.o. male.  Joshua Roach is a 22 y.o. Male with a history of asthma, who presents to the emergency department for evaluation of cough, shortness of breath and wheezing.  Patient reports for the past 3 to 4 days he has had productive cough with yellow sputum.  He reports today his asthma started acting up and he felt shortness of breath with wheezing, last use his albuterol nebulizer at noon today with minimal improvement.  He also reports his voice has been hoarse he reports some mild sore throat, rhinorrhea and nasal congestion.  He has not had any fevers or chills.  Aside from his albuterol nebulizer and some DayQuil and cough drops he has not tried anything else to treat his symptoms.  Denies any other aggravating or alleviating factors.  Former smoker, but denies recent tobacco use.     Past Medical History:  Diagnosis Date  . Asthma     There are no active problems to display for this patient.   History reviewed. No pertinent surgical history.      Home Medications    Prior to Admission medications   Medication Sig Start Date End Date Taking? Authorizing Provider  albuterol (PROVENTIL HFA;VENTOLIN HFA) 108 (90 Base) MCG/ACT inhaler Inhale 2 puffs into the lungs every 6 (six) hours as needed for wheezing or shortness of breath.    [provider]  predniSONE (DELTASONE) 10 MG tablet Take 2 tablets (20 mg total) by mouth daily with breakfast. 05/10/18   Janne Napoleon, NP    Family History Family History  Problem Relation Age of Onset  . Healthy Mother     Social History Social History   Tobacco Use  . Smoking status: Former Smoker    Types: Cigarettes, Cigars  . Smokeless tobacco: Never Used  Substance Use Topics  .  Alcohol use: Yes  . Drug use: Yes    Types: Marijuana    Comment: 2 times a week     Allergies   Patient has no known allergies.   Review of Systems Review of Systems  Constitutional: Negative for chills and fever.  HENT: Positive for congestion, rhinorrhea, sore throat and voice change. Negative for ear pain.   Respiratory: Positive for cough, chest tightness, shortness of breath and wheezing.   Cardiovascular: Negative for chest pain.  Gastrointestinal: Negative for abdominal pain, nausea and vomiting.  Skin: Negative for rash.  Neurological: Negative for light-headedness and headaches.  All other systems reviewed and are negative.    Physical Exam Updated Vital Signs BP (!) 163/78 (BP Location: Left Arm)   Pulse 84   Temp 98.3 F (36.8 C) (Oral)   Resp 16   Ht 5\' 5"  (1.651 m)   Wt 93 kg   SpO2 95%   BMI 34.11 kg/m   Physical Exam  Constitutional: He appears well-developed and well-nourished. No distress.  HENT:  Head: Normocephalic and atraumatic.  Mouth/Throat: Oropharynx is clear and moist.  TMs clear with good landmarks, moderate nasal mucosa edema with clear rhinorrhea, posterior oropharynx clear and moist, with some erythema, no edema or exudates  Eyes: Right eye exhibits no discharge. Left eye exhibits no discharge.  Neck: Normal range of motion. Neck supple.  Cardiovascular: Normal rate, regular rhythm, normal heart sounds and intact distal pulses.  Pulmonary/Chest: Effort normal and breath sounds normal. No respiratory distress.  Respirations equal and unlabored, patient able to speak in full sentences, lungs with mild expiratory wheezes throughout after 1st breathing treatment  Abdominal: Soft. Bowel sounds are normal. He exhibits no distension and no mass. There is no tenderness. There is no guarding.  Abdomen soft, nondistended, nontender to palpation in all quadrants without guarding or peritoneal signs  Lymphadenopathy:    He has no cervical  adenopathy.  Neurological: He is alert. Coordination normal.  Skin: Skin is warm and dry. Capillary refill takes less than 2 seconds. He is not diaphoretic.  Psychiatric: He has a normal mood and affect. His behavior is normal.  Nursing note and vitals reviewed.    ED Treatments / Results  Labs (all labs ordered are listed, but only abnormal results are displayed) Labs Reviewed - No data to display  EKG None  Radiology Dg Chest 2 View  Result Date: 05/24/2018 CLINICAL DATA:  Productive cough x3 days EXAM: CHEST - 2 VIEW COMPARISON:  05/10/2018 FINDINGS: The heart size and mediastinal contours are within normal limits. Both lungs are clear. The visualized skeletal structures are unremarkable. IMPRESSION: No active cardiopulmonary disease. Electronically Signed   By: Tollie Eth M.D.   On: 05/24/2018 19:25    Procedures Procedures (including critical care time)  Medications Ordered in ED Medications  albuterol (PROVENTIL HFA;VENTOLIN HFA) 108 (90 Base) MCG/ACT inhaler 2 puff (has no administration in time range)  aerochamber plus with mask device 1 each (has no administration in time range)  albuterol (PROVENTIL) (2.5 MG/3ML) 0.083% nebulizer solution 5 mg (5 mg Nebulization Given 05/24/18 1848)  ipratropium-albuterol (DUONEB) 0.5-2.5 (3) MG/3ML nebulizer solution 3 mL (3 mLs Nebulization Given 05/24/18 1943)  predniSONE (DELTASONE) tablet 60 mg (60 mg Oral Given 05/24/18 1942)  guaiFENesin (ROBITUSSIN) 100 MG/5ML solution 200 mg (200 mg Oral Given 05/24/18 1943)     Initial Impression / Assessment and Plan / ED Course  I have reviewed the triage vital signs and the nursing notes.  Pertinent labs & imaging results that were available during my care of the patient were reviewed by me and considered in my medical decision making (see chart for details).  Patient presents for evaluation of 3 days of productive cough with worsening of his asthma with shortness of breath and wheezing  today.  Albuterol nebulizer at home not helping.  He is also had some sore throat, hoarseness and nasal congestion.  No fevers or chills.  On arrival vitals table, patient afebrile, satting 91% on room air.  Given albuterol nebulizer with some improvement in his symptoms still with intermittent coughing.  Lungs with mild expiratory wheezes throughout.  HEENT exam most consistent with viral upper respiratory infection.  Given productive cough, chest x-ray ordered which showed no evidence of pneumonia or other active cardiopulmonary disease.  I suspect viral upper respiratory infection causing asthma exacerbation we will treat with Robitussin, prednisone and additional nebulizer treatments and reevaluate.  Breathing significantly improved after second nebulizer treatment and lungs are clear to auscultation.  Will provide patient with albuterol inhaler here in the ED as well as a prescription for steroids.  Have discussed appropriate symptomatic treatment of his URI symptoms.  At this time he is stable for discharge home with PCP follow-up.  Return precautions have been discussed and patient expresses understanding and is in agreement with plan.  Final Clinical Impressions(s) / ED  Diagnoses   Final diagnoses:  Exacerbation of asthma, unspecified asthma severity, unspecified whether persistent  Viral URI with cough    ED Discharge Orders         Ordered    predniSONE (DELTASONE) 50 MG tablet  Daily     05/24/18 1949           Legrand Rams 05/24/18 Netta Corrigan, MD 05/25/18 [id number]

## 2018-05-24 NOTE — Discharge Instructions (Addendum)
Your symptoms are likely caused by a viral upper respiratory infection exacerbating your asthma.  Please take prednisone for the next 5 days as directed, I would like free to use your albuterol inhaler or nebulizer every 4-6 hours for the next 24 hours and then as needed.  Antibiotics are not helpful in treating viral infection, the virus should run its course in about 5-7 days. Please make sure you are drinking plenty of fluids. You can treat your symptoms supportively with tylenol/ibuprofen for fevers and pains, Zyrtec and Flonase to help with nasal congestion, and over the counter cough syrups like mucinex or robitussin and throat lozenges to help with cough. If your symptoms are not improving please follow up with you Primary doctor.   If you develop persistent fevers, worsening wheezing, shortness of breath or difficulty breathing, chest pain, severe headache and neck pain, persistent nausea and vomiting or other new or concerning symptoms return to the Emergency department.

## 2018-05-24 NOTE — ED Triage Notes (Signed)
Patient c/o white/yellow productive cough x 3 days. Patient also has asthmas, and hoarseness. patient had an albuterol neb treatment at 1200 today.

## 2018-06-17 ENCOUNTER — Other Ambulatory Visit: Payer: Self-pay

## 2018-06-17 ENCOUNTER — Encounter (HOSPITAL_COMMUNITY): Payer: Self-pay | Admitting: Emergency Medicine

## 2018-06-17 ENCOUNTER — Emergency Department (HOSPITAL_COMMUNITY)
Admission: EM | Admit: 2018-06-17 | Discharge: 2018-06-17 | Disposition: A | Discharge status: Home / Self Care | Payer: Self-pay | Attending: Emergency Medicine | Admitting: Emergency Medicine

## 2018-06-17 DIAGNOSIS — Z79899 Other long term (current) drug therapy: Secondary | ICD-10-CM | POA: Insufficient documentation

## 2018-06-17 DIAGNOSIS — Z87891 Personal history of nicotine dependence: Secondary | ICD-10-CM | POA: Insufficient documentation

## 2018-06-17 DIAGNOSIS — J45901 Unspecified asthma with (acute) exacerbation: Secondary | ICD-10-CM | POA: Insufficient documentation

## 2018-06-17 MED ORDER — ALBUTEROL SULFATE HFA 108 (90 BASE) MCG/ACT IN AERS
4.0000 | INHALATION_SPRAY | Freq: Once | RESPIRATORY_TRACT | Status: AC
  Administered 2018-06-17: 4 via RESPIRATORY_TRACT
  Filled 2018-06-17: qty 6.7

## 2018-06-17 MED ORDER — PREDNISONE 20 MG PO TABS
60.0000 mg | ORAL_TABLET | Freq: Once | ORAL | Status: AC
  Administered 2018-06-17: 60 mg via ORAL
  Filled 2018-06-17: qty 3

## 2018-06-17 MED ORDER — ALBUTEROL SULFATE (2.5 MG/3ML) 0.083% IN NEBU
5.0000 mg | INHALATION_SOLUTION | Freq: Once | RESPIRATORY_TRACT | Status: AC
  Administered 2018-06-17: 5 mg via RESPIRATORY_TRACT
  Filled 2018-06-17: qty 6

## 2018-06-17 MED ORDER — PREDNISONE 20 MG PO TABS: 60.0000 mg | ORAL_TABLET | Freq: Every day | ORAL | 0 refills | Status: AC

## 2018-06-17 NOTE — ED Triage Notes (Signed)
Pt c/o shortness of breath since last night. States he woke with mild wheezing. Nonproductive cough. Pt is a smoker, has not used any at home treatments.

## 2018-06-17 NOTE — ED Notes (Signed)
ED Provider at bedside. 

## 2018-06-17 NOTE — ED Provider Notes (Signed)
[facility]-EMERGENCY DEPT Provider Note   CSN: [id number] Arrival date & time: 06/17/18  4098     History   Chief Complaint Chief Complaint  Patient presents with  . Asthma    HPI Joshua Roach is a 22 y.o. male.  The history is provided by the patient.  Shortness of Breath  This is a chronic problem. The problem occurs continuously.The current episode started 6 to 12 hours ago. The problem has been gradually improving. Associated symptoms include wheezing. Pertinent negatives include no fever, no coryza, no rhinorrhea, no sore throat, no swollen glands, no ear pain, no neck pain, no cough, no sputum production, no PND, no chest pain, no syncope, no vomiting, no abdominal pain, no rash and no leg swelling. It is unknown what precipitated the problem. He has tried nothing (ran out of albuterol inhaler, forgot he has nebulizer.) for the symptoms. He has had prior ED visits. Associated medical issues include asthma.    Past Medical History:  Diagnosis Date  . Asthma     There are no active problems to display for this patient.   History reviewed. No pertinent surgical history.      Home Medications    Prior to Admission medications   Medication Sig Start Date End Date Taking? Authorizing Provider  albuterol (PROVENTIL HFA;VENTOLIN HFA) 108 (90 Base) MCG/ACT inhaler Inhale 2 puffs into the lungs every 6 (six) hours as needed for wheezing or shortness of breath.    [provider]  predniSONE (DELTASONE) 20 MG tablet Take 3 tablets (60 mg total) by mouth daily for 4 days. 06/17/18 06/21/18  Virgina Norfolk, DO    Family History Family History  Problem Relation Age of Onset  . Healthy Mother     Social History Social History   Tobacco Use  . Smoking status: Former Smoker    Types: Cigarettes, Cigars  . Smokeless tobacco: Never Used  Substance Use Topics  . Alcohol use: Yes  . Drug use: Yes    Types: Marijuana    Comment: 2 times a  week     Allergies   Patient has no known allergies.   Review of Systems Review of Systems  Constitutional: Negative for chills and fever.  HENT: Negative for ear pain, rhinorrhea and sore throat.   Eyes: Negative for pain and visual disturbance.  Respiratory: Positive for shortness of breath and wheezing. Negative for cough and sputum production.   Cardiovascular: Negative for chest pain, palpitations, leg swelling, syncope and PND.  Gastrointestinal: Negative for abdominal pain and vomiting.  Genitourinary: Negative for dysuria and hematuria.  Musculoskeletal: Negative for arthralgias, back pain and neck pain.  Skin: Negative for color change and rash.  Neurological: Negative for seizures and syncope.  All other systems reviewed and are negative.    Physical Exam Updated Vital Signs BP 122/78   Pulse 79   Temp 97.6 F (36.4 C) (Oral)   Resp 16   SpO2 99%   Physical Exam  Constitutional: He is oriented to person, place, and time. He appears well-developed and well-nourished.  HENT:  Head: Normocephalic and atraumatic.  Eyes: Pupils are equal, round, and reactive to light. Conjunctivae and EOM are normal.  Neck: Normal range of motion. Neck supple.  Cardiovascular: Normal rate, regular rhythm, normal heart sounds and intact distal pulses.  No murmur heard. Pulmonary/Chest: Effort normal. No respiratory distress. He has wheezes.  Abdominal: Soft. Bowel sounds are normal. There is no tenderness.  Musculoskeletal: Normal range  of motion. He exhibits no edema.  Neurological: He is alert and oriented to person, place, and time.  Skin: Skin is warm and dry. Capillary refill takes less than 2 seconds.  Psychiatric: He has a normal mood and affect.  Nursing note and vitals reviewed.    ED Treatments / Results  Labs (all labs ordered are listed, but only abnormal results are displayed) Labs Reviewed - No data to display  EKG None  Radiology No results  found.  Procedures Procedures (including critical care time)  Medications Ordered in ED Medications  albuterol (PROVENTIL) (2.5 MG/3ML) 0.083% nebulizer solution 5 mg (5 mg Nebulization Given 06/17/18 0832)  predniSONE (DELTASONE) tablet 60 mg (60 mg Oral Given 06/17/18 0844)  albuterol (PROVENTIL HFA;VENTOLIN HFA) 108 (90 Base) MCG/ACT inhaler 4 puff (4 puffs Inhalation Given 06/17/18 0903)     Initial Impression / Assessment and Plan / ED Course  I have reviewed the triage vital signs and the nursing notes.  Pertinent labs & imaging results that were available during my care of the patient were reviewed by me and considered in my medical decision making (see chart for details).     Joshua Roach is a 22 year old male history of asthma who presents to the ED with asthma exacerbation.  Patient with normal vitals.  No fever.  Patient with mild wheezing when he woke up this morning.  Has had some nonproductive cough, shortness of breath.  No fever.  Patient has scattered wheezing on exam, given nebulizer treatment.  Patient already feeling better with first treatment.  Has no infectious symptoms at this time.  No fever.  Suspect mild asthma exacerbation.  Patient given prednisone.  Patient given albuterol inhaler to use at home.  He states that he does have a nebulizer at home and has refills of his albuterol solution.  Patient felt better after initial treatment.  No signs of respiratory distress.  Given prescription for prednisone and recommend albuterol use every 4 hours while awake for the next 24 hours.  Recommend continued use needed treatment after that.  Recommend follow-up with primary care doctor and told to return to the ED if symptoms worsen.  Patient discharged in good condition.  This chart was dictated using voice recognition software.  Despite best efforts to proofread,  errors can occur which can change the documentation meaning.   Final Clinical Impressions(s) / ED Diagnoses    Final diagnoses:  Mild asthma with exacerbation, unspecified whether persistent    ED Discharge Orders         Ordered    predniSONE (DELTASONE) 20 MG tablet  Daily     06/17/18 0857           Virgina Norfolk, DO 06/17/18 1539

## 2018-07-02 ENCOUNTER — Emergency Department (HOSPITAL_COMMUNITY): Payer: Self-pay

## 2018-07-02 ENCOUNTER — Encounter (HOSPITAL_COMMUNITY): Payer: Self-pay | Admitting: Emergency Medicine

## 2018-07-02 ENCOUNTER — Other Ambulatory Visit: Payer: Self-pay

## 2018-07-02 ENCOUNTER — Emergency Department (HOSPITAL_COMMUNITY)
Admission: EM | Admit: 2018-07-02 | Discharge: 2018-07-02 | Disposition: A | Discharge status: Home / Self Care | Payer: Self-pay | Attending: Emergency Medicine | Admitting: Emergency Medicine

## 2018-07-02 DIAGNOSIS — Z87891 Personal history of nicotine dependence: Secondary | ICD-10-CM | POA: Insufficient documentation

## 2018-07-02 DIAGNOSIS — J069 Acute upper respiratory infection, unspecified: Secondary | ICD-10-CM | POA: Insufficient documentation

## 2018-07-02 DIAGNOSIS — J4521 Mild intermittent asthma with (acute) exacerbation: Secondary | ICD-10-CM | POA: Insufficient documentation

## 2018-07-02 DIAGNOSIS — B9789 Other viral agents as the cause of diseases classified elsewhere: Secondary | ICD-10-CM | POA: Insufficient documentation

## 2018-07-02 MED ORDER — IPRATROPIUM-ALBUTEROL 0.5-2.5 (3) MG/3ML IN SOLN
3.0000 mL | Freq: Once | RESPIRATORY_TRACT | Status: AC
  Administered 2018-07-02: 3 mL via RESPIRATORY_TRACT
  Filled 2018-07-02: qty 3

## 2018-07-02 MED ORDER — PREDNISONE 20 MG PO TABS: 40.0000 mg | ORAL_TABLET | Freq: Every day | ORAL | 0 refills | Status: AC

## 2018-07-02 MED ORDER — PREDNISONE 20 MG PO TABS
60.0000 mg | ORAL_TABLET | Freq: Once | ORAL | Status: AC
  Administered 2018-07-02: 60 mg via ORAL
  Filled 2018-07-02: qty 3

## 2018-07-02 MED ORDER — ALBUTEROL SULFATE HFA 108 (90 BASE) MCG/ACT IN AERS: 2.0000 | INHALATION_SPRAY | Freq: Four times a day (QID) | RESPIRATORY_TRACT | 0 refills | Status: DC | PRN

## 2018-07-02 NOTE — Discharge Instructions (Addendum)
You were evaluated today for asthma exacerbation.  I have written you a prescription for a nebulizer as well as albuterol.  I have also send you home on prednisone.  Please take as prescribed.  Return to the ED for any new or worsening symptoms

## 2018-07-02 NOTE — ED Triage Notes (Signed)
Pt c/o SOB and wheezing during the night and non productive cough. Hx asthma and is current smoker. Was seen here couple weeks ago for same symptoms.

## 2018-07-02 NOTE — ED Provider Notes (Signed)
[facility]-EMERGENCY DEPT Provider Note   CSN: [id number] Arrival date & time: 07/02/18  1042   History   Chief Complaint Chief Complaint  Patient presents with  . Asthma  . Cough    HPI Joshua Roach is a 22 y.o. male with past medical history significant asthma who presents for evaluation of wheezing and shortness of breath.  Patient states yesterday evening he started feeling short of breath with wheezing.  Has noticed that he has had a productive cough of white and yellow sputum over the past 4 days.  Patient states that he currently does not have an inhaler for his asthma.  States he feels short of breath with exertion and he feels like he can hear himself wheezing when he takes a deep breath.  Patient states he did present to the emergency room approximately 2 weeks ago for an asthma exacerbation.  States he was feeling well up until yesterday evening.  Denies fever, chills, nausea, vomiting, chest pain.  Denies sick contacts.  Denies aggravating or alleviating factors.  Has not taken anything for shortness of breath or cough.  Admits to current tobacco and marijuana use. Denies hx of hospital admission or intubation for asthma exacerbation.  History obtained from patient.  No interpreter was used.  HPI  Past Medical History:  Diagnosis Date  . Asthma     There are no active problems to display for this patient.   History reviewed. No pertinent surgical history.      Home Medications    Prior to Admission medications   Medication Sig Start Date End Date Taking? Authorizing Provider  albuterol (PROVENTIL HFA;VENTOLIN HFA) 108 (90 Base) MCG/ACT inhaler Inhale 2 puffs into the lungs every 6 (six) hours as needed for wheezing or shortness of breath. 07/02/18   Ortha Metts A, PA-C  predniSONE (DELTASONE) 20 MG tablet Take 2 tablets (40 mg total) by mouth daily for 4 days. 07/02/18 07/06/18  Ninamarie Keel A, PA-C    Family History Family History    Problem Relation Age of Onset  . Healthy Mother     Social History Social History   Tobacco Use  . Smoking status: Former Smoker    Types: Cigarettes, Cigars  . Smokeless tobacco: Never Used  Substance Use Topics  . Alcohol use: Yes  . Drug use: Yes    Types: Marijuana    Comment: 2 times a week     Allergies   Patient has no known allergies.   Review of Systems Review of Systems  Constitutional: Negative.   HENT: Negative.   Respiratory: Positive for cough and shortness of breath. Negative for apnea, choking, chest tightness, wheezing and stridor.   Cardiovascular: Negative.  Negative for chest pain, palpitations and leg swelling.  Gastrointestinal: Negative.   Genitourinary: Negative.   Skin: Negative.   Neurological: Negative for dizziness and light-headedness.  All other systems reviewed and are negative.    Physical Exam Updated Vital Signs BP (!) 148/83   Pulse 86   Temp 98.3 F (36.8 C) (Oral)   Resp 18   Ht 5\' 5"  (1.651 m)   Wt 97.5 kg   SpO2 96%   BMI 35.78 kg/m   Physical Exam  Constitutional: He appears well-developed and well-nourished. No distress.  HENT:  Head: Atraumatic.  Right Ear: Tympanic membrane, external ear and ear canal normal. Tympanic membrane is not erythematous, not retracted and not bulging.  Left Ear: Tympanic membrane, external ear and ear canal normal.  Tympanic membrane is not erythematous, not retracted and not bulging.  Nose: Nose normal. Right sinus exhibits no maxillary sinus tenderness and no frontal sinus tenderness. Left sinus exhibits no maxillary sinus tenderness and no frontal sinus tenderness.  Mouth/Throat: Uvula is midline, oropharynx is clear and moist and mucous membranes are normal. No oropharyngeal exudate. No tonsillar exudate.  Eyes: Pupils are equal, round, and reactive to light.  Neck: Normal range of motion and phonation normal. Neck supple.  Cardiovascular: Normal rate, regular rhythm, normal heart  sounds and intact distal pulses. Exam reveals no gallop.  No murmur heard. Pulmonary/Chest: Effort normal. No accessory muscle usage or stridor. No tachypnea. No respiratory distress. He has no decreased breath sounds. He has wheezes. He has no rales. He exhibits no tenderness.  Moderate diffuse scattered expiratory wheezing.  Patient is able to speak in full sentences without difficulty.  Oxygen saturation 98% on room air.  Productive cough while in the room on exam.  No retractions or nasal flaring.  Patient is not tachypneic.  Abdominal: Soft. He exhibits no distension.  Musculoskeletal: Normal range of motion.  Neurological: He is alert.  Skin: Skin is warm and dry. He is not diaphoretic.  Psychiatric: He has a normal mood and affect.  Nursing note and vitals reviewed.    ED Treatments / Results  Labs (all labs ordered are listed, but only abnormal results are displayed) Labs Reviewed - No data to display  EKG None  Radiology Dg Chest 2 View  Result Date: 07/02/2018 CLINICAL DATA:  Productive cough EXAM: CHEST - 2 VIEW COMPARISON:  05/24/2018 FINDINGS: The heart size and mediastinal contours are within normal limits. Both lungs are clear. The visualized skeletal structures are unremarkable. IMPRESSION: No active cardiopulmonary disease. Electronically Signed   By: Marlan Palau M.D.   On: 07/02/2018 11:50    Procedures Procedures (including critical care time)  Medications Ordered in ED Medications  predniSONE (DELTASONE) tablet 60 mg (60 mg Oral Given 07/02/18 1210)  ipratropium-albuterol (DUONEB) 0.5-2.5 (3) MG/3ML nebulizer solution 3 mL (3 mLs Nebulization Given 07/02/18 1210)  ipratropium-albuterol (DUONEB) 0.5-2.5 (3) MG/3ML nebulizer solution 3 mL (3 mLs Nebulization Given 07/02/18 1414)     Initial Impression / Assessment and Plan / ED Course  I have reviewed the triage vital signs and the nursing notes.  Pertinent labs & imaging results that were available during  my care of the patient were reviewed by me and considered in my medical decision making (see chart for details).  22 year old male who appears otherwise well presents for evaluation of asthma exacerbation cough.  Afebrile, nonseptic, non-ill-appearing.  Productive cough of yellow sputum.  Moderate diffuse scattered expiratory wheezes on exam.  Not in acute respiratory distress.  Able to speak in full sentences without difficulty.  Oxygen saturation 97% on room air.  No evidence of tachypnea.  No decreased lung sounds.  No retractions.  Will give DuoNeb, steroids, chest x-ray and reevaluate.  Of note patient has been seen 11 times in the last 6 months for similar symptoms.  Patient states he does not have primary care follow-up.  Chest xray without evidence of infiltrates.   1215: Patient has not yet received the nebulizer treatment. Oxygen saturation at 92% on RA. Will re-evaluate.  1310: Lungs with expiratory wheeze. Oxygen saturation at 96. No signs of respiratory distress. No tachypnea or retractions.  Will give additional breathing treatment and reevaluate.  1500: Lungs clear to auscultation bilaterally. Oxygen saturations at 96% on room air with  good waveform.  Patient ambulated with oxygen saturations 95%.  No signs of respiratory distress.  No tachypnea or retractions.  Does not have dyspnea on exertion.  Patient does not have any albuterol inhaler at home at this time.  Will DC home with albuterol inhaler as well as course of steroids.  Last known course of steroids was in August 2019.  Discussed close follow-up and establishing a primary care provider.  Strict return precautions given.  Patient voiced understanding is agreeable for follow-up.    Final Clinical Impressions(s) / ED Diagnoses   Final diagnoses:  Mild intermittent asthma with exacerbation  Viral URI with cough    ED Discharge Orders         Ordered    albuterol (PROVENTIL HFA;VENTOLIN HFA) 108 (90 Base) MCG/ACT inhaler   Every 6 hours PRN     07/02/18 1506    predniSONE (DELTASONE) 20 MG tablet  Daily     07/02/18 1520           Rayn Shorb A, PA-C 07/02/18 1527    Lorre Nick, MD 07/03/18 [phone]

## 2018-07-02 NOTE — ED Notes (Signed)
Bed: WTR5 Expected date:  Expected time:  Means of arrival:  Comments: 

## 2018-07-19 ENCOUNTER — Encounter (HOSPITAL_COMMUNITY): Payer: Self-pay

## 2018-07-19 ENCOUNTER — Emergency Department (HOSPITAL_COMMUNITY)
Admission: EM | Admit: 2018-07-19 | Discharge: 2018-07-19 | Disposition: A | Discharge status: Home / Self Care | Payer: Self-pay | Attending: Emergency Medicine | Admitting: Emergency Medicine

## 2018-07-19 ENCOUNTER — Other Ambulatory Visit: Payer: Self-pay

## 2018-07-19 DIAGNOSIS — Z9114 Patient's other noncompliance with medication regimen: Secondary | ICD-10-CM | POA: Insufficient documentation

## 2018-07-19 DIAGNOSIS — Z87891 Personal history of nicotine dependence: Secondary | ICD-10-CM | POA: Insufficient documentation

## 2018-07-19 DIAGNOSIS — J4521 Mild intermittent asthma with (acute) exacerbation: Secondary | ICD-10-CM | POA: Insufficient documentation

## 2018-07-19 MED ORDER — ALBUTEROL SULFATE (2.5 MG/3ML) 0.083% IN NEBU
5.0000 mg | INHALATION_SOLUTION | Freq: Once | RESPIRATORY_TRACT | Status: AC
  Administered 2018-07-19: 5 mg via RESPIRATORY_TRACT
  Filled 2018-07-19: qty 6

## 2018-07-19 MED ORDER — PREDNISONE 10 MG PO TABS: 20.0000 mg | ORAL_TABLET | Freq: Every day | ORAL | 0 refills | Status: DC

## 2018-07-19 MED ORDER — ALBUTEROL SULFATE HFA 108 (90 BASE) MCG/ACT IN AERS
2.0000 | INHALATION_SPRAY | RESPIRATORY_TRACT | Status: DC | PRN
  Administered 2018-07-19: 2 via RESPIRATORY_TRACT
  Filled 2018-07-19: qty 6.7

## 2018-07-19 MED ORDER — ALBUTEROL SULFATE (2.5 MG/3ML) 0.083% IN NEBU
5.0000 mg | INHALATION_SOLUTION | Freq: Once | RESPIRATORY_TRACT | Status: DC
  Filled 2018-07-19: qty 6

## 2018-07-19 MED ORDER — PREDNISONE 20 MG PO TABS
40.0000 mg | ORAL_TABLET | Freq: Once | ORAL | Status: AC
  Administered 2018-07-19: 40 mg via ORAL
  Filled 2018-07-19: qty 2

## 2018-07-19 NOTE — ED Provider Notes (Signed)
[facility]-EMERGENCY DEPT Provider Note   CSN: [id number] Arrival date & time: 07/19/18  1650     History   Chief Complaint Chief Complaint  Patient presents with  . Asthma    HPI Joshua Roach is a 22 y.o. male.  HPI 22 year old man history of asthma presents today complaining of being out of medication and increased wheezing.  He states that he does not have a primary care provider and is out of his inhaler.  He was using his nebulizer but ran out of medication yesterday.  Had some cough productive of whitish sputum.  He has not had fever or chills.  This is consistent with previous asthma exacerbations.  He has not required any recent hospitalizations.  He reports quitting smoking.  He has had some marijuana use but denies any recently.  He denies vaping over the past year.  He denies chest pain, fever, abdominal pain, nausea, vomiting, leg swelling or history of PE, nasal congestion, sore throat, or ear pain. Past Medical History:  Diagnosis Date  . Asthma     There are no active problems to display for this patient.   History reviewed. No pertinent surgical history.      Home Medications    Prior to Admission medications   Medication Sig Start Date End Date Taking? Authorizing Provider  albuterol (PROVENTIL HFA;VENTOLIN HFA) 108 (90 Base) MCG/ACT inhaler Inhale 2 puffs into the lungs every 6 (six) hours as needed for wheezing or shortness of breath. 07/02/18  Yes Henderly, Britni A, PA-C    Family History Family History  Problem Relation Age of Onset  . Healthy Mother     Social History Social History   Tobacco Use  . Smoking status: Former Smoker    Types: Cigarettes, Cigars  . Smokeless tobacco: Never Used  Substance Use Topics  . Alcohol use: Yes  . Drug use: Yes    Types: Marijuana    Comment: 2 times a week     Allergies   Patient has no known allergies.   Review of Systems Review of Systems  All other systems reviewed  and are negative.    Physical Exam Updated Vital Signs BP (!) 147/82 (BP Location: Right Arm)   Pulse 85   Temp 97.9 F (36.6 C) (Oral)   Resp 18   Ht 1.651 m (5\' 5" )   Wt 99.8 kg   SpO2 99%   BMI 36.61 kg/m   Physical Exam  Constitutional: He is oriented to person, place, and time. He appears well-developed and well-nourished.  HENT:  Head: Normocephalic and atraumatic.  Right Ear: External ear normal.  Left Ear: External ear normal.  Nose: Nose normal.  Mouth/Throat: Oropharynx is clear and moist.  Eyes: Pupils are equal, round, and reactive to light. Conjunctivae and EOM are normal.  Neck: Normal range of motion. Neck supple.  Cardiovascular: Normal rate, regular rhythm, normal heart sounds and intact distal pulses.  Pulmonary/Chest: Effort normal. No respiratory distress. He has wheezes. He exhibits no tenderness.  Few scattered expiratory wheezes  Abdominal: Soft. Bowel sounds are normal. He exhibits no distension and no mass. There is no tenderness. There is no guarding.  Musculoskeletal: Normal range of motion.  Neurological: He is alert and oriented to person, place, and time. He has normal reflexes. He exhibits normal muscle tone. Coordination normal.  Skin: Skin is warm and dry.  Psychiatric: He has a normal mood and affect. His behavior is normal. Judgment and thought content  normal.  Nursing note and vitals reviewed.    ED Treatments / Results  Labs (all labs ordered are listed, but only abnormal results are displayed) Labs Reviewed - No data to display  EKG None  Radiology No results found.  Procedures Procedures (including critical care time)  Medications Ordered in ED Medications  albuterol (PROVENTIL) (2.5 MG/3ML) 0.083% nebulizer solution 5 mg (5 mg Nebulization Not Given 07/19/18 1741)  albuterol (PROVENTIL) (2.5 MG/3ML) 0.083% nebulizer solution 5 mg (5 mg Nebulization Given 07/19/18 1739)  predniSONE (DELTASONE) tablet 40 mg (40 mg Oral  Given 07/19/18 1735)     Initial Impression / Assessment and Plan / ED Course  I have reviewed the triage vital signs and the nursing notes.  Pertinent labs & imaging results that were available during my care of the patient were reviewed by me and considered in my medical decision making (see chart for details).    Patient is treated here with albuterol nebulizer.  He is given prednisone.  He will be given an inhaler and placed on 5 days of prednisone.  Gust need for follow-up.  We have discussed return precautions.  He voiced understanding. Final Clinical Impressions(s) / ED Diagnoses   Final diagnoses:  Mild intermittent asthma with exacerbation  Noncompliance with medication regimen    ED Discharge Orders    None       Margarita Grizzle, MD 07/19/18 1610

## 2018-07-19 NOTE — Discharge Instructions (Addendum)
Please call number on d/c instructions to arrange follow up for better management of your asthma

## 2018-07-19 NOTE — ED Triage Notes (Signed)
Patient c/o asthma and wheezing yesterday. Patient states he is out of his albuterol nebulizer and albuterol inhaler. Paiteant has expiratory wheezing.

## 2018-09-10 ENCOUNTER — Other Ambulatory Visit: Payer: Self-pay

## 2018-09-10 ENCOUNTER — Encounter (HOSPITAL_COMMUNITY): Payer: Self-pay

## 2018-09-10 ENCOUNTER — Emergency Department (HOSPITAL_COMMUNITY)
Admission: EM | Admit: 2018-09-10 | Discharge: 2018-09-10 | Disposition: A | Discharge status: Home / Self Care | Payer: Self-pay | Attending: Emergency Medicine | Admitting: Emergency Medicine

## 2018-09-10 DIAGNOSIS — Z87891 Personal history of nicotine dependence: Secondary | ICD-10-CM | POA: Insufficient documentation

## 2018-09-10 DIAGNOSIS — J45909 Unspecified asthma, uncomplicated: Secondary | ICD-10-CM | POA: Insufficient documentation

## 2018-09-10 DIAGNOSIS — Z79899 Other long term (current) drug therapy: Secondary | ICD-10-CM | POA: Insufficient documentation

## 2018-09-10 MED ORDER — IPRATROPIUM-ALBUTEROL 0.5-2.5 (3) MG/3ML IN SOLN
3.0000 mL | Freq: Once | RESPIRATORY_TRACT | Status: AC
  Administered 2018-09-10: 3 mL via RESPIRATORY_TRACT
  Filled 2018-09-10: qty 3

## 2018-09-10 MED ORDER — ALBUTEROL SULFATE HFA 108 (90 BASE) MCG/ACT IN AERS
1.0000 | INHALATION_SPRAY | Freq: Once | RESPIRATORY_TRACT | Status: AC
  Administered 2018-09-10: 2 via RESPIRATORY_TRACT
  Filled 2018-09-10: qty 6.7

## 2018-09-10 NOTE — Discharge Instructions (Addendum)
Please use inhaler 1-2 puffs every 4-6 hours as needed for chest tightness, wheezing, or shortness of breath.  We have prescribed you new medication(s) today. Discuss the medications prescribed today with your pharmacist as they can have adverse effects and interactions with your other medicines including over the counter and prescribed medications. Seek medical evaluation if you start to experience new or abnormal symptoms after taking one of these medicines, seek care immediately if you start to experience difficulty breathing, feeling of your throat closing, facial swelling, or rash as these could be indications of a more serious allergic reaction  Follow up with [facility] within 1 week for re-evaluation. Return to the ER for new or worsening symptoms or any other concerns.

## 2018-09-10 NOTE — ED Provider Notes (Signed)
[facility]-EMERGENCY DEPT Provider Note   CSN: [id number] Arrival date & time: 09/10/18  1716     History   Chief Complaint Chief Complaint  Patient presents with  . Asthma    HPI Joshua Roach is a 23 y.o. male with a hx of asthma who presents to the ED with complaints of asthma related issues that started this morning.  Patient states that he has been generally feeling at baseline, he states that he woke up and he had some wheezing, chest tightness, and heavy breathing.  He states that his symptoms felt similar to prior issues with asthma.  He states he is unfortunately out of his inhaler and did not have anything to treat the symptoms with.  Symptoms have gradually improved throughout the day.  No specific alleviating or aggravating factors.  Denies fever, chills, productive cough, congestion, ear pain, sore throat, or chest pain.  HPI  Past Medical History:  Diagnosis Date  . Asthma     There are no active problems to display for this patient.   History reviewed. No pertinent surgical history.      Home Medications    Prior to Admission medications   Medication Sig Start Date End Date Taking? Authorizing Provider  albuterol (PROVENTIL HFA;VENTOLIN HFA) 108 (90 Base) MCG/ACT inhaler Inhale 2 puffs into the lungs every 6 (six) hours as needed for wheezing or shortness of breath. 07/02/18   Henderly, Britni A, PA-C  predniSONE (DELTASONE) 10 MG tablet Take 2 tablets (20 mg total) by mouth daily. 07/19/18   Margarita Grizzleay, Danielle, MD    Family History Family History  Problem Relation Age of Onset  . Healthy Mother     Social History Social History   Tobacco Use  . Smoking status: Former Smoker    Types: Cigarettes, Cigars  . Smokeless tobacco: Never Used  Substance Use Topics  . Alcohol use: Yes  . Drug use: Yes    Types: Marijuana    Comment: 2 times a week     Allergies   Patient has no known allergies.   Review of Systems Review of  Systems  Constitutional: Negative for chills, fatigue and fever.  HENT: Negative for congestion, ear pain and sore throat.   Respiratory: Positive for chest tightness, shortness of breath and wheezing. Negative for cough.   Cardiovascular: Negative for chest pain.     Physical Exam Updated Vital Signs BP 140/71 (BP Location: Left Arm)   Pulse 79   Temp 98.3 F (36.8 C) (Oral)   Resp 16   Ht 5\' 5"  (1.651 m)   Wt 101.2 kg   SpO2 98%   BMI 37.11 kg/m   Physical Exam Vitals signs and nursing note reviewed.  Constitutional:      General: He is not in acute distress.    Appearance: He is well-developed. He is not toxic-appearing.  HENT:     Head: Normocephalic and atraumatic.     Right Ear: Tympanic membrane normal.     Left Ear: Tympanic membrane normal.     Nose: Nose normal.     Mouth/Throat:     Mouth: Mucous membranes are moist.     Pharynx: No oropharyngeal exudate or posterior oropharyngeal erythema.  Eyes:     General:        Right eye: No discharge.        Left eye: No discharge.     Conjunctiva/sclera: Conjunctivae normal.  Neck:     Musculoskeletal: Neck supple.  No neck rigidity or muscular tenderness.  Cardiovascular:     Rate and Rhythm: Normal rate and regular rhythm.  Pulmonary:     Effort: Pulmonary effort is normal. No respiratory distress.     Breath sounds: No stridor. Wheezing (mild biphasic throughout) present. No rhonchi or rales.  Abdominal:     General: There is no distension.     Palpations: Abdomen is soft.     Tenderness: There is no abdominal tenderness.  Skin:    General: Skin is warm and dry.     Findings: No rash.  Neurological:     Mental Status: He is alert.     Comments: Clear speech.   Psychiatric:        Behavior: Behavior normal.      ED Treatments / Results  Labs (all labs ordered are listed, but only abnormal results are displayed) Labs Reviewed - No data to display  EKG None  Radiology No results  found.  Procedures Procedures (including critical care time)  Medications Ordered in ED Medications  albuterol (PROVENTIL HFA;VENTOLIN HFA) 108 (90 Base) MCG/ACT inhaler 1-2 puff (has no administration in time range)  ipratropium-albuterol (DUONEB) 0.5-2.5 (3) MG/3ML nebulizer solution 3 mL (3 mLs Nebulization Given 09/10/18 1817)     Initial Impression / Assessment and Plan / ED Course  I have reviewed the triage vital signs and the nursing notes.  Pertinent labs & imaging results that were available during my care of the patient were reviewed by me and considered in my medical decision making (see chart for details).   Patient presents with wheezing, chest tightness, and dyspnea that feel similar to prior asthma issues. Nontoxic appearing, in no apparent distress, vitals WNL. Initial exam with mild biphasic wheezing, duoneb & re-eval.   Patient sxs completely resolved on re-assessment. Repeat lung exam is clear. SpO2 remaining > 98% on RA. No focal adventitious sounds, fever, or cough to suggest pneumonia, no focal absent breath sounds to suggest PTX, offered CXR patient declined which I am in agreement with. PERC negative doubt PE. Suspect asthma as patient had initially expressed concern for. Given resolution w/ duoneb x 1 do not feel this is true exacerbation requiring steroids which he is in agreement with. Albuterol inhaler provided in the ER to go home with. PCP follow up. I discussed results, treatment plan, need for follow-up, and return precautions with the patient. Provided opportunity for questions, patient confirmed understanding and is in agreement with plan.   Final Clinical Impressions(s) / ED Diagnoses   Final diagnoses:  Uncomplicated asthma, unspecified asthma severity, unspecified whether persistent    ED Discharge Orders    None       Desmond Lope 09/10/18 1911    Raeford Razor, MD 09/11/18 1055

## 2018-09-10 NOTE — ED Triage Notes (Signed)
Pt states that he is out of his asthma medication. Pt states he was "breathing heavy" this mornign.

## 2018-10-10 ENCOUNTER — Encounter (HOSPITAL_COMMUNITY): Payer: Self-pay

## 2018-10-10 ENCOUNTER — Emergency Department (HOSPITAL_COMMUNITY)
Admission: EM | Admit: 2018-10-10 | Discharge: 2018-10-10 | Disposition: A | Discharge status: Home / Self Care | Payer: Self-pay | Attending: Emergency Medicine | Admitting: Emergency Medicine

## 2018-10-10 ENCOUNTER — Other Ambulatory Visit: Payer: Self-pay

## 2018-10-10 DIAGNOSIS — J4520 Mild intermittent asthma, uncomplicated: Secondary | ICD-10-CM | POA: Insufficient documentation

## 2018-10-10 DIAGNOSIS — Z87891 Personal history of nicotine dependence: Secondary | ICD-10-CM | POA: Insufficient documentation

## 2018-10-10 MED ORDER — IPRATROPIUM-ALBUTEROL 0.5-2.5 (3) MG/3ML IN SOLN
3.0000 mL | Freq: Once | RESPIRATORY_TRACT | Status: AC
  Administered 2018-10-10: 3 mL via RESPIRATORY_TRACT
  Filled 2018-10-10: qty 3

## 2018-10-10 MED ORDER — PREDNISONE 10 MG PO TABS: 40.0000 mg | ORAL_TABLET | Freq: Every day | ORAL | 0 refills | Status: DC

## 2018-10-10 MED ORDER — ALBUTEROL SULFATE (2.5 MG/3ML) 0.083% IN NEBU
5.0000 mg | INHALATION_SOLUTION | Freq: Once | RESPIRATORY_TRACT | Status: AC
  Administered 2018-10-10: 5 mg via RESPIRATORY_TRACT
  Filled 2018-10-10: qty 6

## 2018-10-10 MED ORDER — PREDNISONE 20 MG PO TABS
60.0000 mg | ORAL_TABLET | Freq: Once | ORAL | Status: AC
  Administered 2018-10-10: 60 mg via ORAL
  Filled 2018-10-10: qty 3

## 2018-10-10 MED ORDER — ALBUTEROL SULFATE HFA 108 (90 BASE) MCG/ACT IN AERS
2.0000 | INHALATION_SPRAY | RESPIRATORY_TRACT | Status: DC | PRN
  Administered 2018-10-10: 2 via RESPIRATORY_TRACT
  Filled 2018-10-10: qty 6.7

## 2018-10-10 NOTE — ED Provider Notes (Signed)
[facility]-EMERGENCY DEPT Provider Note   CSN: [id number] Arrival date & time: 10/10/18  1617     History   Chief Complaint Chief Complaint  Patient presents with  . Asthma    HPI Joshua Roach is a 23 y.o. male with hx of asthma who presents to the ED with c/o cough and wheezing. He reports productive cough with clear mucous yesterday. Patient is out of his albuterol inhalre.    HPI  Past Medical History:  Diagnosis Date  . Asthma     There are no active problems to display for this patient.   History reviewed. No pertinent surgical history.      Home Medications    Prior to Admission medications   Medication Sig Start Date End Date Taking? Authorizing Provider  albuterol (PROVENTIL HFA;VENTOLIN HFA) 108 (90 Base) MCG/ACT inhaler Inhale 2 puffs into the lungs every 6 (six) hours as needed for wheezing or shortness of breath. 07/02/18   Henderly, Britni A, PA-C  predniSONE (DELTASONE) 10 MG tablet Take 4 tablets (40 mg total) by mouth daily with breakfast. 10/10/18   Janne Napoleon, NP    Family History Family History  Problem Relation Age of Onset  . Healthy Mother     Social History Social History   Tobacco Use  . Smoking status: Former Smoker    Types: Cigarettes, Cigars  . Smokeless tobacco: Never Used  Substance Use Topics  . Alcohol use: Yes  . Drug use: Yes    Types: Marijuana    Comment: 2 times a week     Allergies   Patient has no known allergies.   Review of Systems Review of Systems  Respiratory: Positive for cough and wheezing.   All other systems reviewed and are negative.    Physical Exam Updated Vital Signs BP (!) 143/99 (BP Location: Right Arm)   Pulse 82   Temp 98.3 F (36.8 C) (Oral)   Resp 15   Ht 5\' 5"  (1.651 m)   Wt 102.1 kg   SpO2 97%   BMI 37.44 kg/m   Physical Exam Vitals signs and nursing note reviewed.  Constitutional:      General: He is not in acute distress.    Appearance: He is  well-developed.  HENT:     Head: Normocephalic.     Nose: Nose normal.     Mouth/Throat:     Mouth: Mucous membranes are moist.     Pharynx: Oropharynx is clear.  Eyes:     Extraocular Movements: Extraocular movements intact.     Conjunctiva/sclera: Conjunctivae normal.  Neck:     Musculoskeletal: Neck supple.  Cardiovascular:     Rate and Rhythm: Normal rate and regular rhythm.  Pulmonary:     Effort: No respiratory distress.     Breath sounds: Wheezing present. No rhonchi or rales.  Musculoskeletal: Normal range of motion.  Skin:    General: Skin is warm and dry.  Neurological:     Mental Status: He is alert and oriented to person, place, and time.     Cranial Nerves: No cranial nerve deficit.  Psychiatric:        Mood and Affect: Mood normal.      ED Treatments / Results  Labs (all labs ordered are listed, but only abnormal results are displayed) Labs Reviewed - No data to display  Radiology No results found.  Procedures Procedures (including critical care time)  Medications Ordered in ED Medications  albuterol (PROVENTIL  HFA;VENTOLIN HFA) 108 (90 Base) MCG/ACT inhaler 2 puff (2 puffs Inhalation Given 10/10/18 2044)  albuterol (PROVENTIL) (2.5 MG/3ML) 0.083% nebulizer solution 5 mg (5 mg Nebulization Given 10/10/18 1649)  ipratropium-albuterol (DUONEB) 0.5-2.5 (3) MG/3ML nebulizer solution 3 mL (3 mLs Nebulization Given 10/10/18 2001)  predniSONE (DELTASONE) tablet 60 mg (60 mg Oral Given 10/10/18 2001)   Patient re examined after second breathing treatment and lungs are clear. Patient reports feeling better.   Initial Impression / Assessment and Plan / ED Course  I have reviewed the triage vital signs and the nursing notes. 23 y.o. male here with cough and wheezing stable for d/c with symptoms resolving after neb treatments in the ED. RN walked the patient prior to d/c and O2 Sat stayed at 97% on R/A. Will d/c home with albuterol inhaler and short burst of  prednisone.   Final Clinical Impressions(s) / ED Diagnoses   Final diagnoses:  Mild intermittent asthma without complication    ED Discharge Orders         Ordered    predniSONE (DELTASONE) 10 MG tablet  Daily with breakfast     10/10/18 2044           Kerrie [place] Mil[place], [place] 10/10/18 2233    Alvira Monday, MD 10/11/18 1626

## 2018-10-10 NOTE — ED Notes (Signed)
Patient stayed between 94-97% on RA while walking

## 2018-10-10 NOTE — ED Triage Notes (Signed)
Patient c/o asthma/wheezing and a productive cough with clear mucous since yesterday. patient states he does not have an Albuterol inhaler at home.

## 2018-10-10 NOTE — Discharge Instructions (Addendum)
Use your inhaler as needed and follow up with your doctor. Return here as needed.

## 2018-11-01 ENCOUNTER — Emergency Department (HOSPITAL_COMMUNITY)
Admission: EM | Admit: 2018-11-01 | Discharge: 2018-11-01 | Disposition: A | Discharge status: Home / Self Care | Payer: Self-pay | Attending: Emergency Medicine | Admitting: Emergency Medicine

## 2018-11-01 ENCOUNTER — Encounter (HOSPITAL_COMMUNITY): Payer: Self-pay | Admitting: *Deleted

## 2018-11-01 ENCOUNTER — Other Ambulatory Visit: Payer: Self-pay

## 2018-11-01 DIAGNOSIS — Z87891 Personal history of nicotine dependence: Secondary | ICD-10-CM | POA: Insufficient documentation

## 2018-11-01 DIAGNOSIS — J069 Acute upper respiratory infection, unspecified: Secondary | ICD-10-CM | POA: Insufficient documentation

## 2018-11-01 DIAGNOSIS — J45901 Unspecified asthma with (acute) exacerbation: Secondary | ICD-10-CM | POA: Insufficient documentation

## 2018-11-01 MED ORDER — IPRATROPIUM-ALBUTEROL 0.5-2.5 (3) MG/3ML IN SOLN
3.0000 mL | Freq: Once | RESPIRATORY_TRACT | Status: AC
  Administered 2018-11-01: 3 mL via RESPIRATORY_TRACT
  Filled 2018-11-01: qty 3

## 2018-11-01 MED ORDER — ALBUTEROL SULFATE HFA 108 (90 BASE) MCG/ACT IN AERS
1.0000 | INHALATION_SPRAY | Freq: Once | RESPIRATORY_TRACT | Status: AC
  Administered 2018-11-01: 1 via RESPIRATORY_TRACT
  Filled 2018-11-01: qty 6.7

## 2018-11-01 MED ORDER — PREDNISONE 20 MG PO TABS
60.0000 mg | ORAL_TABLET | Freq: Once | ORAL | Status: AC
  Administered 2018-11-01: 60 mg via ORAL
  Filled 2018-11-01: qty 3

## 2018-11-01 MED ORDER — BENZONATATE 100 MG PO CAPS: 100.0000 mg | ORAL_CAPSULE | Freq: Three times a day (TID) | ORAL | 0 refills | Status: DC

## 2018-11-01 MED ORDER — PREDNISONE 50 MG PO TABS: 50.0000 mg | ORAL_TABLET | Freq: Every day | ORAL | 0 refills | Status: AC

## 2018-11-01 NOTE — ED Provider Notes (Signed)
[facility]-EMERGENCY DEPT Provider Note   CSN: [id number] Arrival date & time: 11/01/18  1231    History   Chief Complaint Chief Complaint  Patient presents with  . Asthma    HPI Joshua Roach is a 23 y.o. male for evaluation of shortness of breath, cough, congestion.  Patient states the past 2 days, he has been feeling poorly.  He reports nasal congestion and a mildly productive cough.  Patient states over the past 2 days, he has had worsening asthma symptoms including chest tightness and wheezing.  He does not have an inhaler, but has been using his nebulizer at home with mild relief.  He denies fevers, chills, ear pain, sore throat, chest pain, nausea, vomiting, domino pain, urinary symptoms, normal bowel movements.  Patient states that on average, he uses his inhaler 3-4 times a day.  Patient states he used to be on inhaled steroids, but since he lost insurance he has not been able to do this.  He does not currently have a PCP.  He denies tobacco or drug use.  He denies sick contacts.  He denies recent travel.     HPI  Past Medical History:  Diagnosis Date  . Asthma     There are no active problems to display for this patient.   History reviewed. No pertinent surgical history.      Home Medications    Prior to Admission medications   Medication Sig Start Date End Date Taking? Authorizing Provider  albuterol (PROVENTIL HFA;VENTOLIN HFA) 108 (90 Base) MCG/ACT inhaler Inhale 2 puffs into the lungs every 6 (six) hours as needed for wheezing or shortness of breath. 07/02/18   Henderly, Britni A, PA-C  benzonatate (TESSALON) 100 MG capsule Take 1 capsule (100 mg total) by mouth every 8 (eight) hours. 11/01/18   Paddy Neis, PA-C  predniSONE (DELTASONE) 50 MG tablet Take 1 tablet (50 mg total) by mouth daily with breakfast for 4 days. Starting 3/6 11/01/18 11/05/18  Braelyn Jenson, PA-C    Family History Family History  Problem Relation Age of Onset    . Healthy Mother     Social History Social History   Tobacco Use  . Smoking status: Former Smoker    Types: Cigarettes, Cigars  . Smokeless tobacco: Never Used  Substance Use Topics  . Alcohol use: Yes  . Drug use: Yes    Types: Marijuana    Comment: 2 times a week     Allergies   Patient has no known allergies.   Review of Systems Review of Systems  HENT: Positive for congestion.   Respiratory: Positive for cough and chest tightness.   All other systems reviewed and are negative.    Physical Exam Updated Vital Signs BP (!) 138/100 (BP Location: Right Arm)   Pulse 69   Temp 97.9 F (36.6 C) (Oral)   Resp 16   Ht 5\' 5"  (1.651 m)   Wt 98.4 kg   SpO2 95%   BMI 36.11 kg/m   Physical Exam Vitals signs and nursing note reviewed.  Constitutional:      General: He is not in acute distress.    Appearance: He is well-developed.     Comments: Sitting comfortably in the bed in no acute distress  HENT:     Head: Normocephalic and atraumatic.     Comments: OP clear without tonsillar swelling or exudate.  Uvula midline with equal palate rise.  TMs nonerythematous nonbulging bilaterally.  Nasal congestion.  No  tenderness palpation of the sinuses    Right Ear: Tympanic membrane, ear canal and external ear normal.     Left Ear: Tympanic membrane, ear canal and external ear normal.     Nose: Mucosal edema present.     Right Sinus: No maxillary sinus tenderness or frontal sinus tenderness.     Left Sinus: No maxillary sinus tenderness or frontal sinus tenderness.     Mouth/Throat:     Pharynx: Uvula midline.     Tonsils: No tonsillar exudate.  Eyes:     Extraocular Movements: Extraocular movements intact.     Conjunctiva/sclera: Conjunctivae normal.     Pupils: Pupils are equal, round, and reactive to light.  Neck:     Musculoskeletal: Normal range of motion.  Cardiovascular:     Rate and Rhythm: Normal rate and regular rhythm.     Pulses: Normal pulses.  Pulmonary:      Effort: Pulmonary effort is normal.     Breath sounds: Wheezing present. No decreased breath sounds, rhonchi or rales.     Comments: Speaking in full sentences.  Scattered wheezing heard on expiration.  No respiratory distress Abdominal:     General: There is no distension.     Palpations: Abdomen is soft. There is no mass.     Tenderness: There is no abdominal tenderness. There is no guarding.  Musculoskeletal: Normal range of motion.  Lymphadenopathy:     Cervical: No cervical adenopathy.  Skin:    General: Skin is warm.     Capillary Refill: Capillary refill takes less than 2 seconds.  Neurological:     Mental Status: He is alert and oriented to person, place, and time.      ED Treatments / Results  Labs (all labs ordered are listed, but only abnormal results are displayed) Labs Reviewed - No data to display  EKG None  Radiology No results found.  Procedures Procedures (including critical care time)  Medications Ordered in ED Medications  ipratropium-albuterol (DUONEB) 0.5-2.5 (3) MG/3ML nebulizer solution 3 mL (3 mLs Nebulization Given 11/01/18 1432)  predniSONE (DELTASONE) tablet 60 mg (60 mg Oral Given 11/01/18 1432)  albuterol (PROVENTIL HFA;VENTOLIN HFA) 108 (90 Base) MCG/ACT inhaler 1 puff (1 puff Inhalation Given 11/01/18 1531)     Initial Impression / Assessment and Plan / ED Course  I have reviewed the triage vital signs and the nursing notes.  Pertinent labs & imaging results that were available during my care of the patient were reviewed by me and considered in my medical decision making (see chart for details).        Patient presenting with 3 day h/o URI symptoms.  Physical exam reassuring, patient is afebrile and appears nontoxic.  Pulmonary exam with mild wheezing.  No respiratory distress..  Doubt pneumonia, strep, other bacterial infection, or peritonsillar abscess.  Likely viral illness, complicated by asthma exacerbation.  Will give DuoNeb,  prednisone, and reassess.  On reassessment, patient reports breathing is improved.  Pulmonary exam without wheezing.  Discussed findings and plan with patient.  Discussed symptomatic treatment, as well as prednisone and inhaler use.  Patient given inhaler to go home with.  Encouraged follow-up with primary care. At this time, patient appears safe for discharge.  Return precautions given.  Patient states he understands and agrees to plan.  Final Clinical Impressions(s) / ED Diagnoses   Final diagnoses:  Upper respiratory tract infection, unspecified type  Exacerbation of asthma, unspecified asthma severity, unspecified whether persistent    ED Discharge  Orders         Ordered    predniSONE (DELTASONE) 50 MG tablet  Daily with breakfast     11/01/18 1438    benzonatate (TESSALON) 100 MG capsule  Every 8 hours     11/01/18 1439           Edger Husain, PA-C 11/01/18 1658    Charlynne Pander, MD 11/05/18 [phone]

## 2018-11-01 NOTE — Discharge Instructions (Addendum)
Take prednisone as prescribed. Use your inhaler every 4 hours for the next 2 days.  After this, use only as needed for shortness of breath or chest tightness. Use Tylenol or ibuprofen as needed for fever or body aches. Use Tessalon perles for cough. It is very important that you establish care with a primary care office.  There is information for 2 clinics listed below. Return to the emergency room with any new, worsening, concerning symptoms.

## 2018-11-01 NOTE — ED Triage Notes (Signed)
Pt w/ hx of asthma complains of shortness of breath and chest tightness x 3 days. Pt tried a breathing treatment this morning, which provided some relief. Pt does not have inhaler.

## 2018-12-06 ENCOUNTER — Other Ambulatory Visit: Payer: Self-pay

## 2018-12-06 ENCOUNTER — Emergency Department (HOSPITAL_COMMUNITY)
Admission: EM | Admit: 2018-12-06 | Discharge: 2018-12-06 | Disposition: A | Discharge status: Home / Self Care | Payer: Self-pay | Attending: Emergency Medicine | Admitting: Emergency Medicine

## 2018-12-06 ENCOUNTER — Encounter (HOSPITAL_COMMUNITY): Payer: Self-pay | Admitting: Emergency Medicine

## 2018-12-06 DIAGNOSIS — J4521 Mild intermittent asthma with (acute) exacerbation: Secondary | ICD-10-CM | POA: Insufficient documentation

## 2018-12-06 DIAGNOSIS — Z87891 Personal history of nicotine dependence: Secondary | ICD-10-CM | POA: Insufficient documentation

## 2018-12-06 DIAGNOSIS — R0602 Shortness of breath: Secondary | ICD-10-CM | POA: Insufficient documentation

## 2018-12-06 DIAGNOSIS — F1210 Cannabis abuse, uncomplicated: Secondary | ICD-10-CM | POA: Insufficient documentation

## 2018-12-06 DIAGNOSIS — J45909 Unspecified asthma, uncomplicated: Secondary | ICD-10-CM | POA: Insufficient documentation

## 2018-12-06 DIAGNOSIS — R05 Cough: Secondary | ICD-10-CM | POA: Insufficient documentation

## 2018-12-06 DIAGNOSIS — Z760 Encounter for issue of repeat prescription: Secondary | ICD-10-CM | POA: Insufficient documentation

## 2018-12-06 MED ORDER — ALBUTEROL SULFATE (2.5 MG/3ML) 0.083% IN NEBU: 2.5000 mg | INHALATION_SOLUTION | Freq: Four times a day (QID) | RESPIRATORY_TRACT | 12 refills | Status: DC | PRN

## 2018-12-06 MED ORDER — PREDNISONE 10 MG (21) PO TBPK: ORAL_TABLET | Freq: Every day | ORAL | 0 refills | Status: DC

## 2018-12-06 MED ORDER — ALBUTEROL SULFATE HFA 108 (90 BASE) MCG/ACT IN AERS: 1.0000 | INHALATION_SPRAY | Freq: Four times a day (QID) | RESPIRATORY_TRACT | 0 refills | Status: DC | PRN

## 2018-12-06 MED ORDER — AEROCHAMBER PLUS FLO-VU MEDIUM MISC
1.0000 | Freq: Once | Status: AC
  Administered 2018-12-06: 09:00:00 1
  Filled 2018-12-06: qty 1

## 2018-12-06 MED ORDER — PREDNISONE 20 MG PO TABS
60.0000 mg | ORAL_TABLET | Freq: Once | ORAL | Status: AC
  Administered 2018-12-06: 60 mg via ORAL
  Filled 2018-12-06: qty 3

## 2018-12-06 MED ORDER — ALBUTEROL SULFATE HFA 108 (90 BASE) MCG/ACT IN AERS
1.0000 | INHALATION_SPRAY | Freq: Once | RESPIRATORY_TRACT | Status: AC
  Administered 2018-12-06: 2 via RESPIRATORY_TRACT
  Filled 2018-12-06: qty 6.7

## 2018-12-06 NOTE — ED Notes (Signed)
ED Provider at bedside. 

## 2018-12-06 NOTE — ED Triage Notes (Signed)
Pt c/o ran out of asthma and allergy medications couple days ago and had some SOB. reports doesn't have insurance and having a hard time getting [facility].

## 2018-12-06 NOTE — Discharge Instructions (Signed)
Return to ED if you start to have worsening symptoms, increased shortness of breath or wheezing, tightness in her chest, chest pain or leg swelling.

## 2018-12-06 NOTE — ED Provider Notes (Signed)
[facility]-EMERGENCY DEPT Provider Note   CSN: [id number] Arrival date & time: 12/06/18  0820    History   Chief Complaint Chief Complaint  Patient presents with   Asthma   Medication Refill    HPI Clelia SchaumannKane M Weinrich is a 23 y.o. male with a past medical history of asthma who presents to the ED for asthma exacerbation for the past 2 days.  He reports wheezing and shortness of breath as well as a dry cough which is typical of his asthma exacerbations.  He ran out of his albuterol inhaler 2 days ago which he believes caused worsening of his symptoms.  He does not have a refill for this medication from his PCP.  He denies any chest pain, hemoptysis, fever, sick contacts with similar symptoms, exposure or travel to an area that is high risk for COVID-19.     HPI  Past Medical History:  Diagnosis Date   Asthma     There are no active problems to display for this patient.   History reviewed. No pertinent surgical history.      Home Medications    Prior to Admission medications   Medication Sig Start Date End Date Taking? Authorizing Provider  albuterol (PROVENTIL HFA;VENTOLIN HFA) 108 (90 Base) MCG/ACT inhaler Inhale 1-2 puffs into the lungs every 6 (six) hours as needed for wheezing or shortness of breath. 12/06/18   Imara Standiford, PA-C  albuterol (PROVENTIL) (2.5 MG/3ML) 0.083% nebulizer solution Take 3 mLs (2.5 mg total) by nebulization every 6 (six) hours as needed for wheezing or shortness of breath. 12/06/18   Tamon Parkerson, PA-C  benzonatate (TESSALON) 100 MG capsule Take 1 capsule (100 mg total) by mouth every 8 (eight) hours. 11/01/18   Caccavale, Sophia, PA-C  predniSONE (STERAPRED UNI-PAK 21 TAB) 10 MG (21) TBPK tablet Take by mouth daily. Take 6 tabs by mouth daily  for 2 days, then 5 tabs for 2 days, then 4 tabs for 2 days, then 3 tabs for 2 days, 2 tabs for 2 days, then 1 tab by mouth daily for 2 days 12/06/18   Dietrich PatesKhatri, Ruthanna Macchia, PA-C    Family History Family  History  Problem Relation Age of Onset   Healthy Mother     Social History Social History   Tobacco Use   Smoking status: Former Smoker    Types: Cigarettes, Cigars   Smokeless tobacco: Never Used  Substance Use Topics   Alcohol use: Yes   Drug use: Yes    Types: Marijuana    Comment: 2 times a week     Allergies   Patient has no known allergies.   Review of Systems Review of Systems  Constitutional: Negative for chills and fever.  Respiratory: Positive for cough, shortness of breath and wheezing. Negative for chest tightness.   Cardiovascular: Negative for chest pain.     Physical Exam Updated Vital Signs BP 136/68 (BP Location: Right Arm)    Pulse 76    Temp 97.9 F (36.6 C) (Oral)    Resp 19    SpO2 97%   Physical Exam Vitals signs and nursing note reviewed.  Constitutional:      General: He is not in acute distress.    Appearance: He is well-developed. He is not diaphoretic.     Comments: Nontoxic-appearing in no acute distress.  Speaking in complete sentences without difficulty.  HENT:     Head: Normocephalic and atraumatic.  Eyes:     General: No scleral icterus.  Conjunctiva/sclera: Conjunctivae normal.  Neck:     Musculoskeletal: Normal range of motion.  Cardiovascular:     Rate and Rhythm: Normal rate and regular rhythm.  Pulmonary:     Effort: Pulmonary effort is normal. No respiratory distress.     Breath sounds: Wheezing (Faint end expiratory wheezing throughout bilateral lung fields) present.  Skin:    Findings: No rash.  Neurological:     Mental Status: He is alert.      ED Treatments / Results  Labs (all labs ordered are listed, but only abnormal results are displayed) Labs Reviewed - No data to display  EKG None  Radiology No results found.  Procedures Procedures (including critical care time)  Medications Ordered in ED Medications  albuterol (PROVENTIL HFA;VENTOLIN HFA) 108 (90 Base) MCG/ACT inhaler 1-2 puff (2  puffs Inhalation Given 12/06/18 0841)  AeroChamber Plus Flo-Vu Medium MISC 1 each (1 each Other Given 12/06/18 0841)  predniSONE (DELTASONE) tablet 60 mg (60 mg Oral Given 12/06/18 0841)     Initial Impression / Assessment and Plan / ED Course  I have reviewed the triage vital signs and the nursing notes.  Pertinent labs & imaging results that were available during my care of the patient were reviewed by me and considered in my medical decision making (see chart for details).        Joshua Roach was evaluated in Emergency Department on 12/06/18  for the symptoms described in the history of present illness. He/she was evaluated in the context of the global COVID-19 pandemic, which necessitated consideration that the patient might be at risk for infection with the SARS-CoV-2 virus that causes COVID-19. Institutional protocols and algorithms that pertain to the evaluation of patients at risk for COVID-19 are in a state of rapid change based on information released by regulatory bodies including the CDC and federal and state organizations. These policies and algorithms were followed during the patient's care in the ED.  23 year old male with past medical history of asthma presents to ED for asthma exacerbation and requesting refill of his inhaler.  Had worsening chest tightness, wheezing and shortness of breath related to his asthma for the past 2 days after he ran out of his albuterol inhaler.  He ran out of his albuterol nebulizer solution in the past as well.  No sick contacts with similar symptoms, denies any fevers, productive cough, body aches.  He has not been in contact with positive for cocaine 18 or travel high-risk area.  On my exam he is overall well-appearing.  He has faint end expiratory wheezing in bilateral lung fields.  Speaking complete sentences without difficulty.  No tachypnea, tachycardia or hypoxia noted.  He is afebrile with no recent use of antipyretics.  Patient given prednisone,  albuterol inhaler here with improvement in his symptoms.  Given refill of inhaler and nebulizer solution as well as remainder of prednisone course.  Suspect that symptoms are due to asthma exacerbation in the setting of viral illness, lack of inhaler use.  We will have him follow-up with PCP and return to ED for any severe worsening symptoms.   Patient is hemodynamically stable, in NAD, and able to ambulate in the ED. Evaluation does not show pathology that would require ongoing emergent intervention or inpatient treatment. I explained the diagnosis to the patient. Pain has been managed and has no complaints prior to discharge. Patient is comfortable with above plan and is stable for discharge at this time. All questions were answered prior to  disposition. Strict return precautions for returning to the ED were discussed. Encouraged follow up with PCP.   An After Visit Summary was printed and given to the patient.   Portions of this note were generated with Scientist, clinical (histocompatibility and immunogenetics). Dictation errors may occur despite best attempts at proofreading.  Final Clinical Impressions(s) / ED Diagnoses   Final diagnoses:  Mild intermittent asthma with exacerbation  Medication refill    ED Discharge Orders         Ordered    albuterol (PROVENTIL HFA;VENTOLIN HFA) 108 (90 Base) MCG/ACT inhaler  Every 6 hours PRN     12/06/18 0843    albuterol (PROVENTIL) (2.5 MG/3ML) 0.083% nebulizer solution  Every 6 hours PRN     12/06/18 0843    predniSONE (STERAPRED UNI-PAK 21 TAB) 10 MG (21) TBPK tablet  Daily     12/06/18 0843           Dietrich Pates, PA-C 12/06/18 0847    Raeford Razor, MD 12/06/18 [phone]

## 2019-01-01 ENCOUNTER — Emergency Department (HOSPITAL_COMMUNITY)
Admission: EM | Admit: 2019-01-01 | Discharge: 2019-01-01 | Disposition: A | Discharge status: Home / Self Care | Payer: Self-pay | Attending: Emergency Medicine | Admitting: Emergency Medicine

## 2019-01-01 ENCOUNTER — Encounter (HOSPITAL_COMMUNITY): Payer: Self-pay | Admitting: Emergency Medicine

## 2019-01-01 ENCOUNTER — Other Ambulatory Visit: Payer: Self-pay

## 2019-01-01 DIAGNOSIS — Z79899 Other long term (current) drug therapy: Secondary | ICD-10-CM | POA: Insufficient documentation

## 2019-01-01 DIAGNOSIS — J4520 Mild intermittent asthma, uncomplicated: Secondary | ICD-10-CM | POA: Insufficient documentation

## 2019-01-01 DIAGNOSIS — Z87891 Personal history of nicotine dependence: Secondary | ICD-10-CM | POA: Insufficient documentation

## 2019-01-01 MED ORDER — ALBUTEROL SULFATE HFA 108 (90 BASE) MCG/ACT IN AERS
4.0000 | INHALATION_SPRAY | Freq: Once | RESPIRATORY_TRACT | Status: AC
  Administered 2019-01-01: 4 via RESPIRATORY_TRACT
  Filled 2019-01-01: qty 6.7

## 2019-01-01 MED ORDER — ALBUTEROL SULFATE HFA 108 (90 BASE) MCG/ACT IN AERS: 2.0000 | INHALATION_SPRAY | RESPIRATORY_TRACT | 3 refills | Status: DC | PRN

## 2019-01-01 NOTE — ED Triage Notes (Signed)
Pt reports been out of inhaler for about 3 days.

## 2019-01-01 NOTE — ED Notes (Signed)
Bed: [facility] Expected date:  Expected time:  Means of arrival:  Comments: 

## 2019-01-01 NOTE — ED Provider Notes (Addendum)
[facility]-EMERGENCY DEPT Provider Note   CSN: [id number] Arrival date & time: 01/01/19  0716    History   Chief Complaint Chief Complaint  Patient presents with  . Asthma    HPI Clelia SchaumannKane M Sayre is a 23 y.o. male.     The history is provided by the patient.  Wheezing  Severity:  Mild Onset quality:  Gradual Timing:  Intermittent Progression:  Waxing and waning Chronicity:  Recurrent Context comment:  Ran out of albuterol Relieved by:  Nothing Worsened by:  Nothing Associated symptoms: no chest pain, no cough, no ear pain, no fever, no rash, no shortness of breath and no sore throat     Past Medical History:  Diagnosis Date  . Asthma     There are no active problems to display for this patient.   History reviewed. No pertinent surgical history.      Home Medications    Prior to Admission medications   Medication Sig Start Date End Date Taking? Authorizing Provider  albuterol (VENTOLIN HFA) 108 (90 Base) MCG/ACT inhaler Inhale 2 puffs into the lungs every 4 (four) hours as needed for up to 30 days for wheezing or shortness of breath. 01/01/19 01/31/19  Britani Beattie, DO  benzonatate (TESSALON) 100 MG capsule Take 1 capsule (100 mg total) by mouth every 8 (eight) hours. 11/01/18   Caccavale, Sophia, PA-C  predniSONE (STERAPRED UNI-PAK 21 TAB) 10 MG (21) TBPK tablet Take by mouth daily. Take 6 tabs by mouth daily  for 2 days, then 5 tabs for 2 days, then 4 tabs for 2 days, then 3 tabs for 2 days, 2 tabs for 2 days, then 1 tab by mouth daily for 2 days 12/06/18   Dietrich PatesKhatri, Hina, PA-C    Family History Family History  Problem Relation Age of Onset  . Healthy Mother     Social History Social History   Tobacco Use  . Smoking status: Former Smoker    Types: Cigarettes, Cigars  . Smokeless tobacco: Never Used  Substance Use Topics  . Alcohol use: Yes  . Drug use: Yes    Types: Marijuana    Comment: 2 times a week     Allergies   Patient has  no known allergies.   Review of Systems Review of Systems  Constitutional: Negative for chills and fever.  HENT: Negative for ear pain and sore throat.   Eyes: Negative for pain and visual disturbance.  Respiratory: Positive for wheezing. Negative for cough and shortness of breath.   Cardiovascular: Negative for chest pain and palpitations.  Gastrointestinal: Negative for abdominal pain and vomiting.  Genitourinary: Negative for dysuria and hematuria.  Musculoskeletal: Negative for arthralgias and back pain.  Skin: Negative for color change and rash.  Neurological: Negative for seizures and syncope.  All other systems reviewed and are negative.    Physical Exam Updated Vital Signs  ED Triage Vitals [01/01/19 0726]  Enc Vitals Group     BP 135/83     Pulse Rate 60     Resp 18     Temp 98.6 F (37 C)     Temp Source Oral     SpO2 97 %     Weight      Height      Head Circumference      Peak Flow      Pain Score      Pain Loc      Pain Edu?      Excl.  in GC?     Physical Exam Vitals signs and nursing note reviewed.  Constitutional:      Appearance: He is well-developed.  HENT:     Head: Normocephalic and atraumatic.  Eyes:     Extraocular Movements: Extraocular movements intact.     Conjunctiva/sclera: Conjunctivae normal.     Pupils: Pupils are equal, round, and reactive to light.  Neck:     Musculoskeletal: Neck supple.  Cardiovascular:     Rate and Rhythm: Normal rate and regular rhythm.     Pulses: Normal pulses.     Heart sounds: Normal heart sounds. No murmur.  Pulmonary:     Effort: Pulmonary effort is normal. No respiratory distress.     Breath sounds: Wheezing present.  Abdominal:     General: There is no distension.     Palpations: Abdomen is soft.     Tenderness: There is no abdominal tenderness.  Skin:    General: Skin is warm and dry.  Neurological:     General: No focal deficit present.     Mental Status: He is alert.      ED  Treatments / Results  Labs (all labs ordered are listed, but only abnormal results are displayed) Labs Reviewed - No data to display  EKG None  Radiology No results found.  Procedures Procedures (including critical care time)  Medications Ordered in ED Medications  albuterol (VENTOLIN HFA) 108 (90 Base) MCG/ACT inhaler 4 puff (has no administration in time range)     Initial Impression / Assessment and Plan / ED Course  I have reviewed the triage vital signs and the nursing notes.  Pertinent labs & imaging results that were available during my care of the patient were reviewed by me and considered in my medical decision making (see chart for details).     NOOH MARKUNAS is a 23 year old male history of asthma who presents to the ED with mild asthma symptoms.  Patient with normal vitals.  No fever.  Patient with normal work of breathing.  Patient has monthly visits to the ED for similar.  Needs new prescription for albuterol. Unable to get into wellness center due to coronavirus, has no insurance and cant afford medications.  Denies any fever, chills.  No signs of respiratory distress.  Overall well-appearing.  Will give albuterol inhaler.  Instructed patient that he needs to follow-up with wellness center.  Given prescription for albuterol.  No concern for infectious process.  Given return precautions and discharged from ED in good condition.  This chart was dictated using voice recognition software.  Despite best efforts to proofread,  errors can occur which can change the documentation meaning.    Final Clinical Impressions(s) / ED Diagnoses   Final diagnoses:  Mild intermittent asthma, unspecified whether complicated    ED Discharge Orders         Ordered    albuterol (VENTOLIN HFA) 108 (90 Base) MCG/ACT inhaler  Every 4 hours PRN     01/01/19 0721           Virgina Norfolk, DO 01/01/19 2585    Virgina Norfolk, DO 01/01/19 [phone]

## 2019-02-23 IMAGING — CR DG CHEST 2V
2 series · 2 of 2 positions shown · non-contrast
Comparison: 01/21/2018

CLINICAL DATA: Woke up this morning coughing up mucus, mild
shortness of breath, history asthma, former smoker

EXAM:
CHEST - 2 VIEW

[w chest pa]
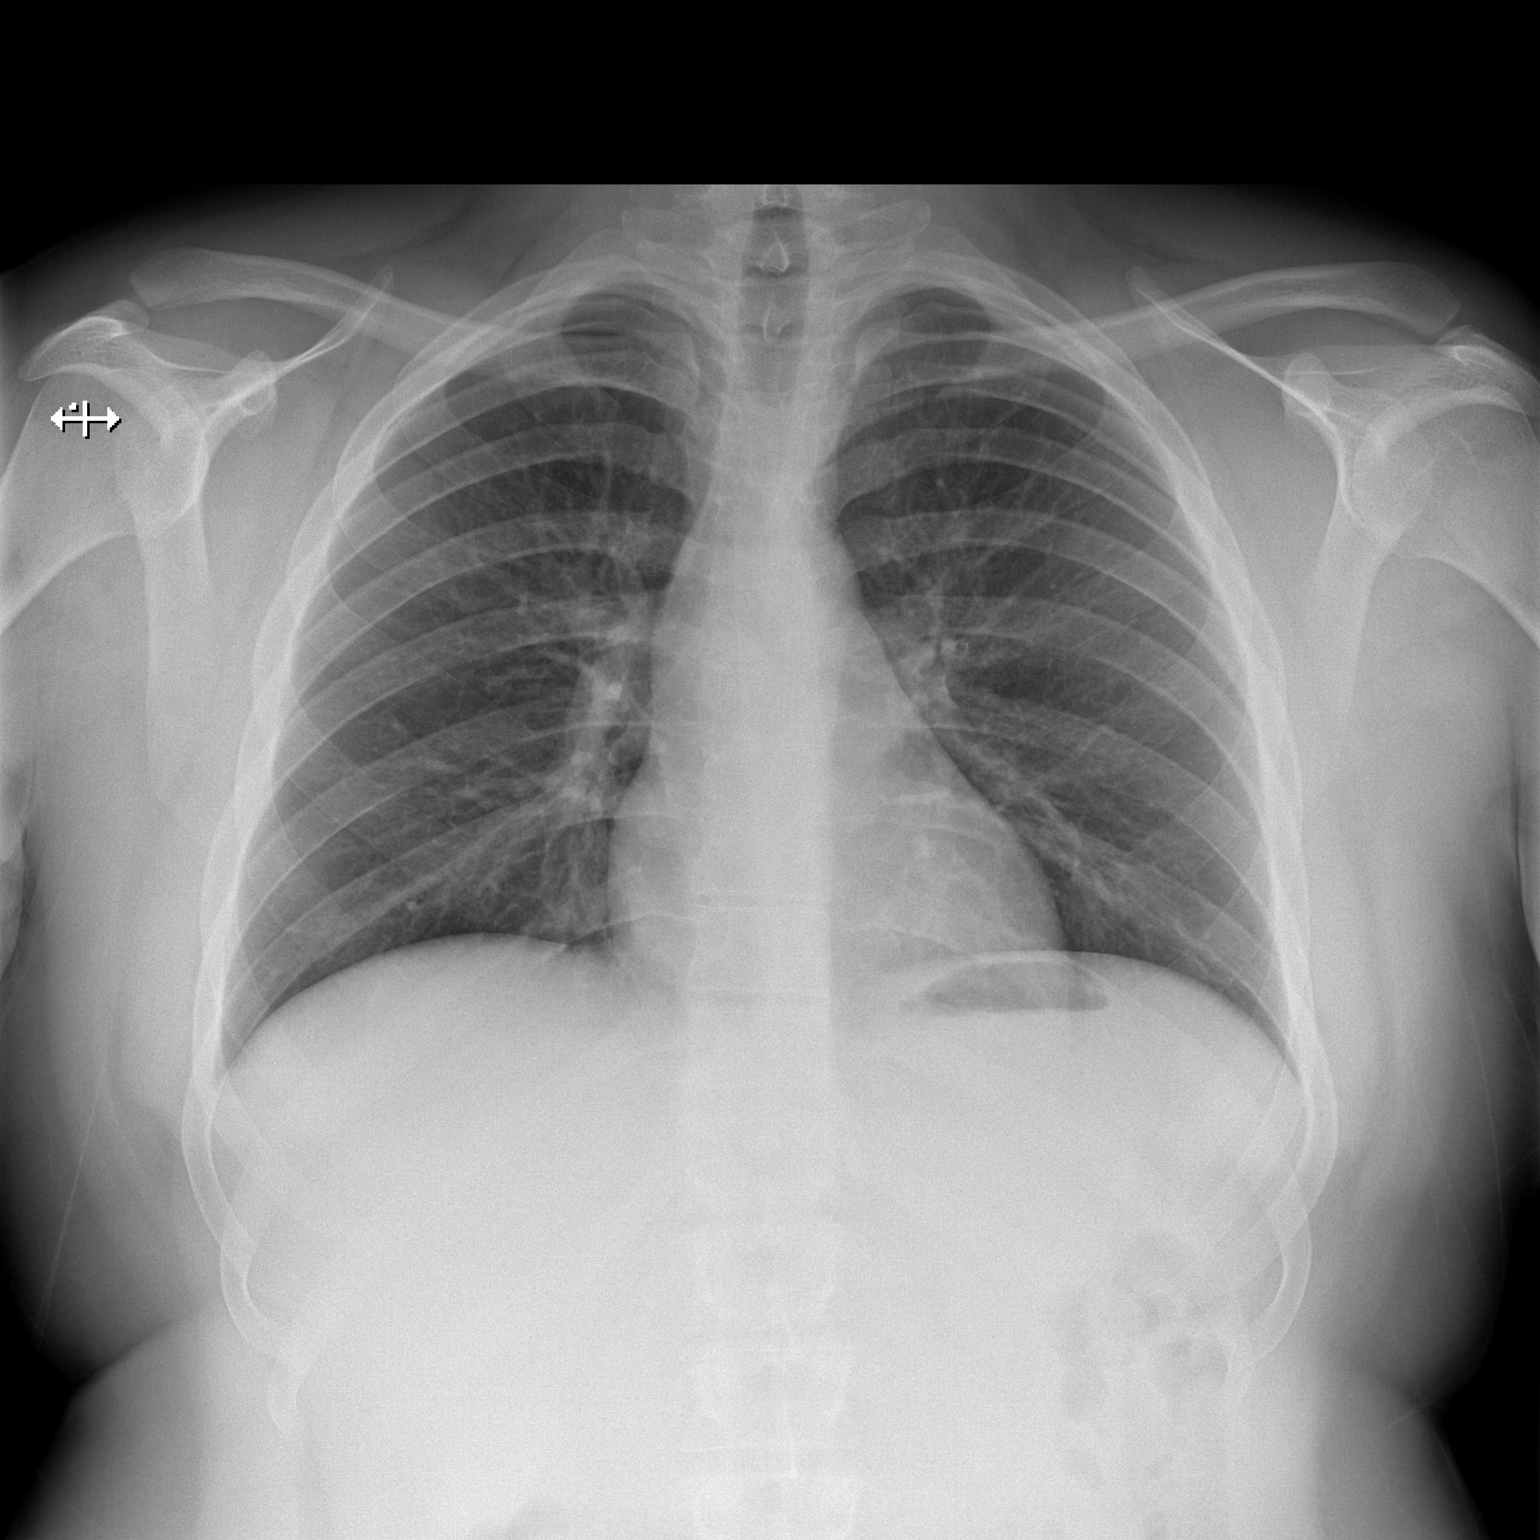

[w chest lat]
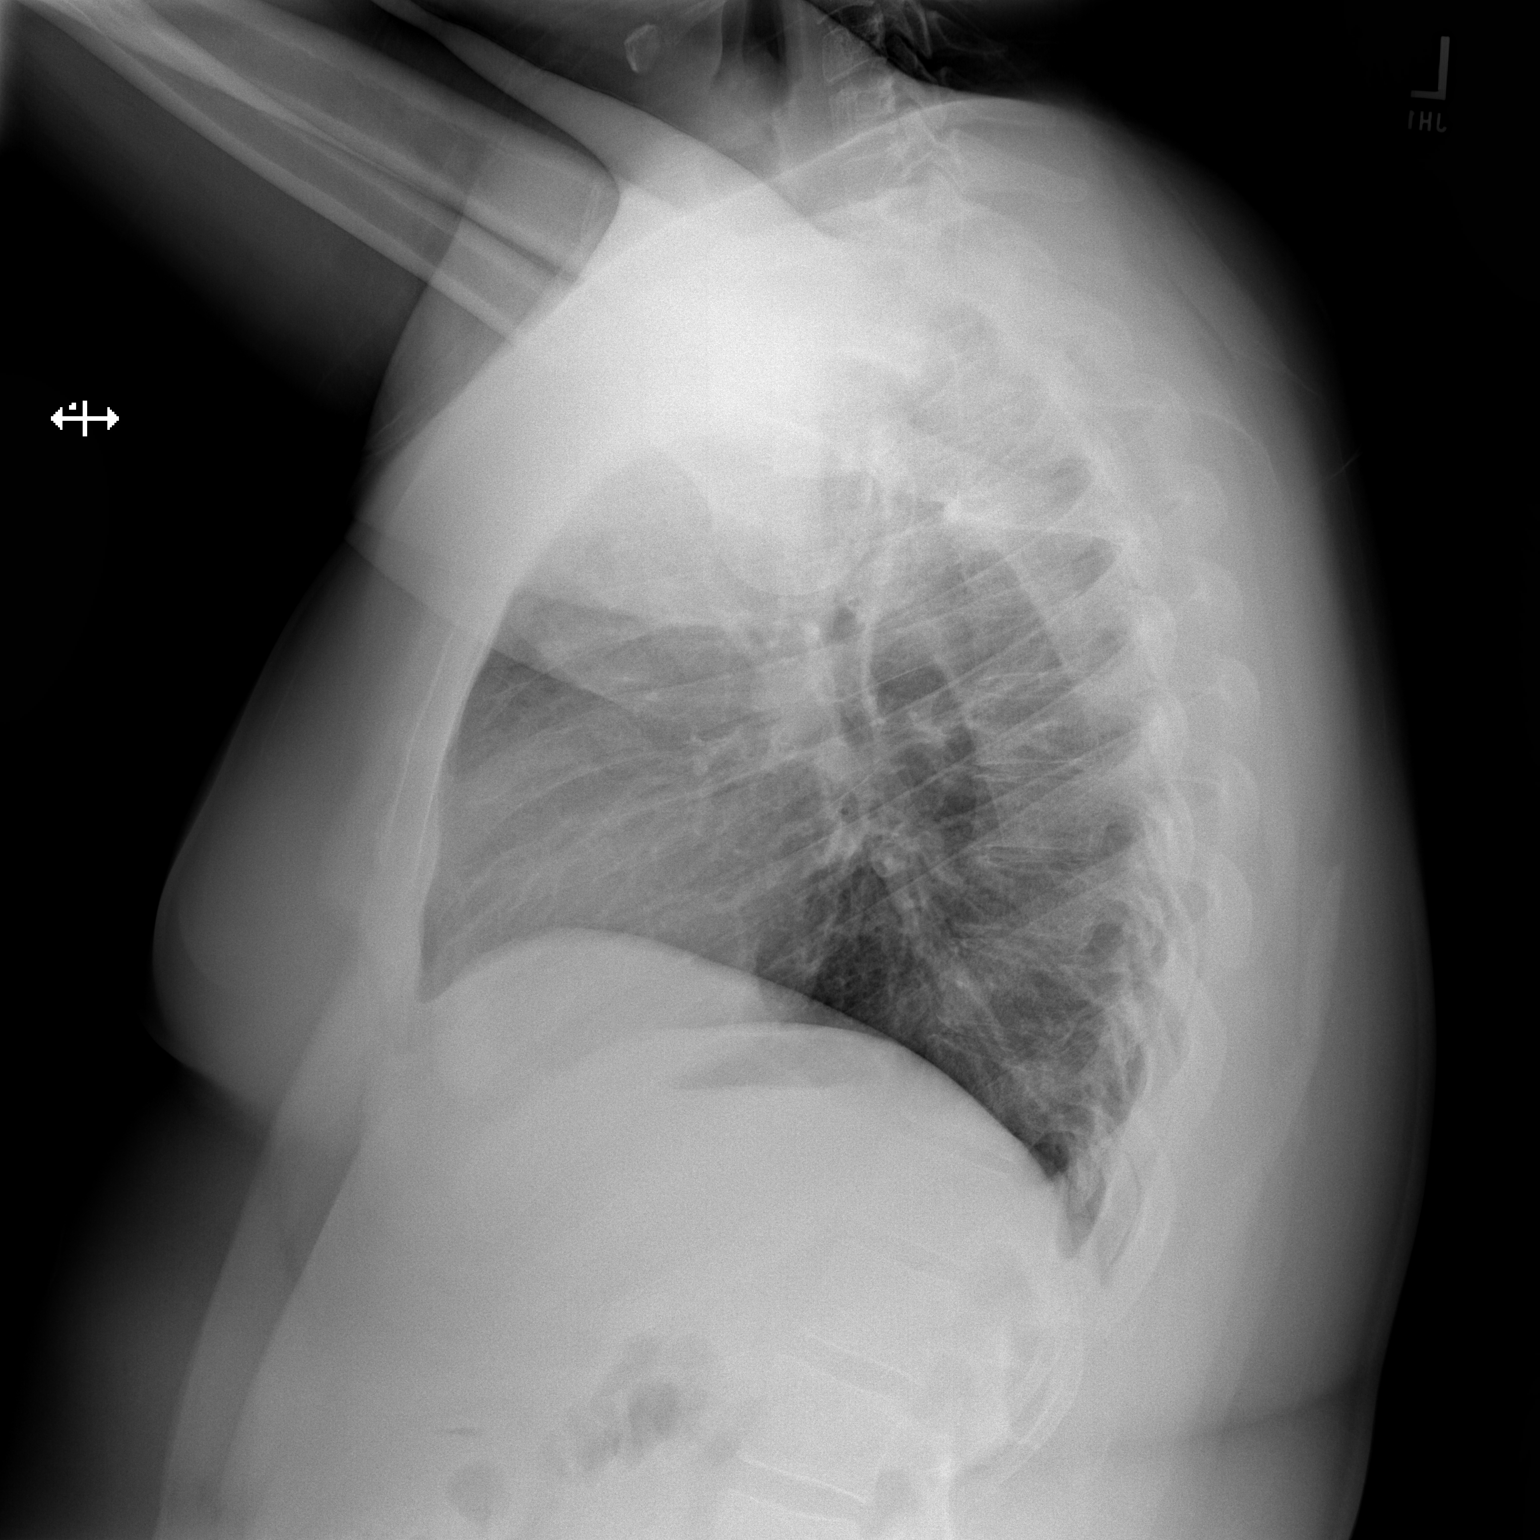

[2 of 2 positions shown; findings below may reference images not displayed]

FINDINGS: Normal heart size, mediastinal contours, and pulmonary vascularity.

Lungs clear.

No pleural effusion or pneumothorax.

Bones unremarkable.
IMPRESSION: Normal exam.

## 2019-03-10 ENCOUNTER — Other Ambulatory Visit: Payer: Self-pay

## 2019-03-10 ENCOUNTER — Encounter (HOSPITAL_COMMUNITY): Payer: Self-pay | Admitting: Emergency Medicine

## 2019-03-10 ENCOUNTER — Emergency Department (HOSPITAL_COMMUNITY)
Admission: EM | Admit: 2019-03-10 | Discharge: 2019-03-10 | Disposition: A | Discharge status: Home / Self Care | Payer: Self-pay | Attending: Emergency Medicine | Admitting: Emergency Medicine

## 2019-03-10 DIAGNOSIS — Z760 Encounter for issue of repeat prescription: Secondary | ICD-10-CM | POA: Insufficient documentation

## 2019-03-10 DIAGNOSIS — Z87891 Personal history of nicotine dependence: Secondary | ICD-10-CM | POA: Insufficient documentation

## 2019-03-10 DIAGNOSIS — J4521 Mild intermittent asthma with (acute) exacerbation: Secondary | ICD-10-CM | POA: Insufficient documentation

## 2019-03-10 MED ORDER — PREDNISONE 20 MG PO TABS: 40.0000 mg | ORAL_TABLET | Freq: Every day | ORAL | 0 refills | Status: DC

## 2019-03-10 MED ORDER — ALBUTEROL SULFATE HFA 108 (90 BASE) MCG/ACT IN AERS
2.0000 | INHALATION_SPRAY | RESPIRATORY_TRACT | Status: DC | PRN
  Administered 2019-03-10: 2 via RESPIRATORY_TRACT
  Filled 2019-03-10: qty 6.7

## 2019-03-10 NOTE — Discharge Instructions (Signed)
It was my pleasure taking care of you today!   Take prednisone daily starting tomorrow morning.   Follow up with your primary care provider for further discussion of today's ER visit.   If you develop any new or worsening symptoms, including but not limited to fever, persistent vomiting, worsening shortness of breath or other symptoms that concern you, please return to the Emergency Department immediately.

## 2019-03-10 NOTE — ED Provider Notes (Signed)
[facility] DEPT Provider Note   CSN: [id number] Arrival date & time: 03/10/19  1447     History   Chief Complaint Chief Complaint  Patient presents with  . Medication Refill  . Asthma    HPI Joshua Roach is a 23 y.o. male.     The history is provided by the patient and medical records. No language interpreter was used.  Medication Refill Asthma Pertinent negatives include no chest pain, no abdominal pain and no shortness of breath.   Joshua Roach is a 23 y.o. male  with a PMH of asthna who presents to the Emergency Department complaining of congestion in his chest for about 2 to 3 days.  He states that he is out of his home albuterol inhaler.  He did take a puff out of his brother's inhaler yesterday and felt much better after doing this.  He has had a little bit of a dry cough which is usual for him.  He states this constellation of symptoms occurs to him around this time every year.  He denies any fever or chills.  No difficulty breathing.  No chest pain.  No abdominal pain, nausea or vomiting.  Denies any known COVID exposure and states that he has been isolating and social distancing as he is very concerned about catching coronavirus with his history of asthma.  Past Medical History:  Diagnosis Date  . Asthma     There are no active problems to display for this patient.   History reviewed. No pertinent surgical history.      Home Medications    Prior to Admission medications   Medication Sig Start Date End Date Taking? Authorizing Provider  albuterol (VENTOLIN HFA) 108 (90 Base) MCG/ACT inhaler Inhale 2 puffs into the lungs every 4 (four) hours as needed for up to 30 days for wheezing or shortness of breath. 01/01/19 01/31/19  Curatolo, Adam, DO  benzonatate (TESSALON) 100 MG capsule Take 1 capsule (100 mg total) by mouth every 8 (eight) hours. 11/01/18   Caccavale, Sophia, PA-C  predniSONE (DELTASONE) 20 MG tablet Take 2 tablets (40 mg  total) by mouth daily. 03/10/19   Carolyne Whitsel, Ozella Almond, PA-C    Family History Family History  Problem Relation Age of Onset  . Healthy Mother     Social History Social History   Tobacco Use  . Smoking status: Former Smoker    Types: Cigarettes, Cigars  . Smokeless tobacco: Never Used  Substance Use Topics  . Alcohol use: Yes  . Drug use: Yes    Types: Marijuana    Comment: 2 times a week     Allergies   Patient has no known allergies.   Review of Systems Review of Systems  Constitutional: Negative for fever.  HENT: Positive for congestion. Negative for sore throat and trouble swallowing.   Respiratory: Positive for cough. Negative for shortness of breath and wheezing.   Cardiovascular: Negative for chest pain, palpitations and leg swelling.  Gastrointestinal: Negative for abdominal pain, diarrhea, nausea and vomiting.  Skin: Negative for rash.     Physical Exam Updated Vital Signs BP (!) 146/61 (BP Location: Left Arm)   Pulse 67   Temp 98.3 F (36.8 C) (Oral)   Resp 18   SpO2 98%   Physical Exam Vitals signs and nursing note reviewed.  Constitutional:      General: He is not in acute distress.    Appearance: He is well-developed.  HENT:  Head: Normocephalic and atraumatic.     Mouth/Throat:     Comments: OP clear. Neck:     Musculoskeletal: Neck supple.  Cardiovascular:     Rate and Rhythm: Normal rate and regular rhythm.     Heart sounds: Normal heart sounds. No murmur.  Pulmonary:     Effort: Pulmonary effort is normal. No respiratory distress.     Comments: Faint expiratory wheezing bilaterally.  No crackles.  Speaking in full sentences without any difficulty. Abdominal:     General: There is no distension.     Palpations: Abdomen is soft.     Tenderness: There is no abdominal tenderness.  Skin:    General: Skin is warm and dry.  Neurological:     Mental Status: He is alert and oriented to person, place, and time.      ED Treatments /  Results  Labs (all labs ordered are listed, but only abnormal results are displayed) Labs Reviewed - No data to display  EKG None  Radiology No results found.  Procedures Procedures (including critical care time)  Medications Ordered in ED Medications  albuterol (VENTOLIN HFA) 108 (90 Base) MCG/ACT inhaler 2 puff (has no administration in time range)     Initial Impression / Assessment and Plan / ED Course  I have reviewed the triage vital signs and the nursing notes.  Pertinent labs & imaging results that were available during my care of the patient were reviewed by me and considered in my medical decision making (see chart for details).       Joshua Roach is a 23 y.o. male who presents to ED requesting refill of his albuterol inhaler.  He has been having dry cough for the last few days consistent with his typical asthma exacerbation.  On exam, he is afebrile, hemodynamically stable with very faint expiratory wheezing on exam.  He denies knowing that he has been wheezing at home.  Discussed plan for chest x-ray, but he feels this is unnecessary.  He would prefer to have refill of his inhaler and to go home.  Albuterol inhaler given.  Will treat with a short burst of steroids as well.  Encouraged PCP follow-up.  Reasons to return to the emergency department were discussed and all questions were answered.   Final Clinical Impressions(s) / ED Diagnoses   Final diagnoses:  Mild intermittent asthma with exacerbation    ED Discharge Orders         Ordered    predniSONE (DELTASONE) 20 MG tablet  Daily     03/10/19 1553           Dhruti Ghuman, Chase PicketJaime Pilcher, PA-C 03/10/19 1614    Lorre NickAllen, Anthony, MD 03/13/19 [phone]

## 2019-03-10 NOTE — ED Triage Notes (Signed)
Patient here from home requesting refill on albuterol inhaler. Hx of asthma.  

## 2019-03-31 ENCOUNTER — Encounter (HOSPITAL_COMMUNITY): Payer: Self-pay

## 2019-03-31 ENCOUNTER — Emergency Department (HOSPITAL_COMMUNITY)
Admission: EM | Admit: 2019-03-31 | Discharge: 2019-03-31 | Disposition: A | Discharge status: Home / Self Care | Payer: Self-pay | Attending: Emergency Medicine | Admitting: Emergency Medicine

## 2019-03-31 ENCOUNTER — Other Ambulatory Visit: Payer: Self-pay

## 2019-03-31 DIAGNOSIS — Z87891 Personal history of nicotine dependence: Secondary | ICD-10-CM | POA: Insufficient documentation

## 2019-03-31 DIAGNOSIS — Z79899 Other long term (current) drug therapy: Secondary | ICD-10-CM | POA: Insufficient documentation

## 2019-03-31 DIAGNOSIS — J45901 Unspecified asthma with (acute) exacerbation: Secondary | ICD-10-CM | POA: Insufficient documentation

## 2019-03-31 DIAGNOSIS — Z760 Encounter for issue of repeat prescription: Secondary | ICD-10-CM | POA: Insufficient documentation

## 2019-03-31 MED ORDER — ALBUTEROL SULFATE HFA 108 (90 BASE) MCG/ACT IN AERS
2.0000 | INHALATION_SPRAY | RESPIRATORY_TRACT | Status: DC
  Administered 2019-03-31: 18:00:00 2 via RESPIRATORY_TRACT
  Filled 2019-03-31: qty 6.7

## 2019-03-31 MED ORDER — ALBUTEROL SULFATE HFA 108 (90 BASE) MCG/ACT IN AERS: 2.0000 | INHALATION_SPRAY | Freq: Four times a day (QID) | RESPIRATORY_TRACT | 1 refills | Status: DC | PRN

## 2019-03-31 MED ORDER — PREDNISONE 10 MG PO TABS: ORAL_TABLET | ORAL | 0 refills | Status: DC

## 2019-03-31 NOTE — ED Provider Notes (Signed)
Patient placed in Quick Look pathway, seen and evaluated   Chief Complaint: wheezing  HPI:   Hx of asthma- worse when it is hot. 1 week of increased sxs. No inhaler at home  ROS: Wheezing, mild persistent cough, tightness (one)  Physical Exam:   Gen: No distress  Neuro: Awake and Alert  Skin: Warm    Focused Exam: Speaking in full sentences Breathing unlabored   Initiation of care has begun. The patient has been counseled on the process, plan, and necessity for staying for the completion/evaluation, and the remainder of the medical screening examination    Margarita Mail, Hershal Coria 03/31/19 1422    Dorie Rank, MD 04/01/19 1100

## 2019-03-31 NOTE — ED Provider Notes (Signed)
[facility] DEPT Provider Note   CSN: [id number] Arrival date & time: 03/31/19  1413     History   Chief Complaint Chief Complaint  Patient presents with  . Asthma  . Medication Refill    HPI Joshua Roach is a 23 y.o. male.     Patient is a 23 year old male who comes to the emergency department chief complaint of asthma  exacerbation he reports that he ran out of his medications.  Patient reports he supposed to be using a Proventil inhaler.  He has been on prednisone in the past and feels like he needs to be on prednisone currently  The history is provided by the patient. No language interpreter was used.  Asthma This is a recurrent problem. The symptoms are aggravated by smoking. He has tried nothing for the symptoms. The treatment provided no relief.  Medication Refill   Past Medical History:  Diagnosis Date  . Asthma     There are no active problems to display for this patient.   History reviewed. No pertinent surgical history.      Home Medications    Prior to Admission medications   Medication Sig Start Date End Date Taking? Authorizing Provider  albuterol (VENTOLIN HFA) 108 (90 Base) MCG/ACT inhaler Inhale 2 puffs into the lungs every 4 (four) hours as needed for up to 30 days for wheezing or shortness of breath. 01/01/19 01/31/19  Curatolo, Adam, DO  benzonatate (TESSALON) 100 MG capsule Take 1 capsule (100 mg total) by mouth every 8 (eight) hours. 11/01/18   Caccavale, Sophia, PA-C  predniSONE (DELTASONE) 20 MG tablet Take 2 tablets (40 mg total) by mouth daily. 03/10/19   Ward, Ozella Almond, PA-C    Family History Family History  Problem Relation Age of Onset  . Healthy Mother     Social History Social History   Tobacco Use  . Smoking status: Former Smoker    Types: Cigarettes, Cigars  . Smokeless tobacco: Never Used  Substance Use Topics  . Alcohol use: Yes  . Drug use: Yes    Types: Marijuana    Comment: 2 times a  week     Allergies   Patient has no known allergies.   Review of Systems Review of Systems  All other systems reviewed and are negative.    Physical Exam Updated Vital Signs BP 136/75 (BP Location: Left Arm)   Pulse 80   Temp 98.8 F (37.1 C) (Oral)   Resp 14   Ht 5\' 5"  (1.651 m)   Wt 95.3 kg   SpO2 98%   BMI 34.95 kg/m   Physical Exam Vitals signs and nursing note reviewed.  Constitutional:      Appearance: He is well-developed.  HENT:     Head: Normocephalic and atraumatic.     Right Ear: Tympanic membrane normal.     Left Ear: Tympanic membrane normal.  Eyes:     Conjunctiva/sclera: Conjunctivae normal.  Neck:     Musculoskeletal: Neck supple.  Cardiovascular:     Rate and Rhythm: Normal rate and regular rhythm.     Heart sounds: No murmur.  Pulmonary:     Effort: No respiratory distress.     Breath sounds: Wheezing and rhonchi present.  Abdominal:     Palpations: Abdomen is soft.     Tenderness: There is no abdominal tenderness.  Musculoskeletal: Normal range of motion.  Skin:    General: Skin is warm and dry.  Neurological:  General: No focal deficit present.     Mental Status: He is alert.  Psychiatric:        Mood and Affect: Mood normal.      ED Treatments / Results  Labs (all labs ordered are listed, but only abnormal results are displayed) Labs Reviewed - No data to display  EKG None  Radiology No results found.  Procedures Procedures (including critical care time)  Medications Ordered in ED Medications - No data to display   Initial Impression / Assessment and Plan / ED Course  I have reviewed the triage vital signs and the nursing notes.  Pertinent labs & imaging results that were available during my care of the patient were reviewed by me and considered in my medical decision making (see chart for details).        Pt given albuterol inhaler here.   Rx for prednisone and albuterol  Final Clinical Impressions(s) /  ED Diagnoses   Final diagnoses:  Moderate asthma with exacerbation, unspecified whether persistent    ED Discharge Orders         Ordered    albuterol (VENTOLIN HFA) 108 (90 Base) MCG/ACT inhaler  Every 6 hours PRN     03/31/19 1720    predniSONE (DELTASONE) 10 MG tablet     03/31/19 1720        An After Visit Summary was printed and given to the patient.    Elson AreasSofia, Grazia Taffe K, PA-C 03/31/19 1721    Derwood KaplanNanavati, Ankit, MD 04/03/19 [phone]

## 2019-03-31 NOTE — Discharge Instructions (Signed)
Return if any problems.

## 2019-03-31 NOTE — ED Triage Notes (Signed)
Pt states that he has hx of asthma. Pt states he has been out of his inhaler x 1 week. Pt states SHOB with activity

## 2019-05-25 ENCOUNTER — Other Ambulatory Visit: Payer: Self-pay

## 2019-05-25 ENCOUNTER — Encounter (HOSPITAL_COMMUNITY): Payer: Self-pay | Admitting: *Deleted

## 2019-05-25 DIAGNOSIS — F1210 Cannabis abuse, uncomplicated: Secondary | ICD-10-CM | POA: Insufficient documentation

## 2019-05-25 DIAGNOSIS — Z87891 Personal history of nicotine dependence: Secondary | ICD-10-CM | POA: Insufficient documentation

## 2019-05-25 DIAGNOSIS — J4521 Mild intermittent asthma with (acute) exacerbation: Secondary | ICD-10-CM | POA: Insufficient documentation

## 2019-05-25 NOTE — ED Triage Notes (Signed)
Pt arrives ambulatory to triage stating he is out of his inhaler for several days and he feels like his asthma is "acting up" Denies that he has had a cough, just feels "tightness' when he tries to breath. Mild expiratory right lung wheezing. No distress.

## 2019-05-26 ENCOUNTER — Emergency Department (HOSPITAL_COMMUNITY)
Admission: EM | Admit: 2019-05-26 | Discharge: 2019-05-26 | Disposition: A | Discharge status: Home / Self Care | Payer: Self-pay | Attending: Emergency Medicine | Admitting: Emergency Medicine

## 2019-05-26 DIAGNOSIS — J4521 Mild intermittent asthma with (acute) exacerbation: Secondary | ICD-10-CM

## 2019-05-26 MED ORDER — ALBUTEROL SULFATE HFA 108 (90 BASE) MCG/ACT IN AERS: 2.0000 | INHALATION_SPRAY | Freq: Four times a day (QID) | RESPIRATORY_TRACT | 1 refills | Status: DC | PRN

## 2019-05-26 MED ORDER — DEXAMETHASONE 4 MG PO TABS
10.0000 mg | ORAL_TABLET | Freq: Once | ORAL | Status: AC
  Administered 2019-05-26: 10 mg via ORAL
  Filled 2019-05-26: qty 2

## 2019-05-26 MED ORDER — ALBUTEROL SULFATE HFA 108 (90 BASE) MCG/ACT IN AERS
6.0000 | INHALATION_SPRAY | Freq: Once | RESPIRATORY_TRACT | Status: AC
  Administered 2019-05-26: 01:00:00 6 via RESPIRATORY_TRACT
  Filled 2019-05-26: qty 6.7

## 2019-05-26 NOTE — ED Provider Notes (Signed)
[facility]-EMERGENCY DEPT Provider Note   CSN: [id number] Arrival date & time: 05/25/19  2034     History   Chief Complaint Chief Complaint  Patient presents with  . Asthma    HPI Joshua Roach is a 23 y.o. male.     23 year old male with a history of asthma presents to the emergency department for evaluation of chest tightness and wheezing.  States that he has been out of his inhaler for the past 4 days and feels that his asthma is acting up.  He has issues with asthma following exercise.  Has not had any fevers, hemoptysis, syncope.  Does not have a primary care doctor with whom he can follow-up due to lack of insurance.  The history is provided by the patient. No language interpreter was used.  Asthma    Past Medical History:  Diagnosis Date  . Asthma     There are no active problems to display for this patient.   History reviewed. No pertinent surgical history.      Home Medications    Prior to Admission medications   Medication Sig Start Date End Date Taking? Authorizing Provider  albuterol (VENTOLIN HFA) 108 (90 Base) MCG/ACT inhaler Inhale 2 puffs into the lungs every 6 (six) hours as needed for wheezing or shortness of breath. 05/26/19   Antony Madura, PA-C  predniSONE (DELTASONE) 10 MG tablet 6,5,4,3,2,1 taper 03/31/19   Elson Areas, PA-C    Family History Family History  Problem Relation Age of Onset  . Healthy Mother     Social History Social History   Tobacco Use  . Smoking status: Former Smoker    Types: Cigarettes, Cigars  . Smokeless tobacco: Never Used  Substance Use Topics  . Alcohol use: Yes  . Drug use: Yes    Types: Marijuana    Comment: 2 times a week     Allergies   Patient has no known allergies.   Review of Systems Review of Systems Ten systems reviewed and are negative for acute change, except as noted in the HPI.    Physical Exam Updated Vital Signs BP 128/86 (BP Location: Right Arm)   Pulse  (!) 56   Temp 98.2 F (36.8 C) (Oral)   Resp 20   SpO2 100%   Physical Exam Vitals signs and nursing note reviewed.  Constitutional:      General: He is not in acute distress.    Appearance: He is well-developed. He is not diaphoretic.     Comments: Nontoxic-appearing and in no distress  HENT:     Head: Normocephalic and atraumatic.  Eyes:     General: No scleral icterus.    Conjunctiva/sclera: Conjunctivae normal.  Neck:     Musculoskeletal: Normal range of motion.  Pulmonary:     Effort: Pulmonary effort is normal. No respiratory distress.     Breath sounds: Wheezing present.     Comments: Very faint expiratory wheeze in the right upper and right lower lobes.  Respirations even and unlabored. Musculoskeletal: Normal range of motion.  Skin:    General: Skin is warm and dry.     Coloration: Skin is not pale.     Findings: No erythema or rash.  Neurological:     General: No focal deficit present.     Mental Status: He is alert and oriented to person, place, and time.     Coordination: Coordination normal.     Comments: GCS 15.  Patient moving all  extremities.  Psychiatric:        Behavior: Behavior normal.      ED Treatments / Results  Labs (all labs ordered are listed, but only abnormal results are displayed) Labs Reviewed - No data to display  EKG None  Radiology No results found.  Procedures Procedures (including critical care time)  Medications Ordered in ED Medications  albuterol (VENTOLIN HFA) 108 (90 Base) MCG/ACT inhaler 6 puff (6 puffs Inhalation Given 05/26/19 0038)  dexamethasone (DECADRON) tablet 10 mg (10 mg Oral Given 05/26/19 0037)     Initial Impression / Assessment and Plan / ED Course  I have reviewed the triage vital signs and the nursing notes.  Pertinent labs & imaging results that were available during my care of the patient were reviewed by me and considered in my medical decision making (see chart for details).         23 year old male presenting for refill of his inhaler.  He has very faint expiratory wheeze in the right upper and right lower lobes.  Given an albuterol inhaler for use in the emergency department as well as a prescription for inhaler refills.  Counseled on the need for primary care follow-up.  Return precautions discussed and provided. Patient discharged in stable condition with no unaddressed concerns.  Vitals:   05/25/19 2046  BP: 128/86  Pulse: (!) 56  Resp: 20  Temp: 98.2 F (36.8 C)  TempSrc: Oral  SpO2: 100%    Final Clinical Impressions(s) / ED Diagnoses   Final diagnoses:  Mild intermittent asthma with exacerbation    ED Discharge Orders         Ordered    albuterol (VENTOLIN HFA) 108 (90 Base) MCG/ACT inhaler  Every 6 hours PRN     05/26/19 0033           Antonietta Breach, PA-C 05/26/19 0047    Palumbo, April, MD 05/26/19 0100

## 2019-05-31 ENCOUNTER — Other Ambulatory Visit: Payer: Self-pay

## 2019-05-31 DIAGNOSIS — Z20822 Contact with and (suspected) exposure to covid-19: Secondary | ICD-10-CM

## 2019-06-01 LAB — NOVEL CORONAVIRUS, NAA: SARS-CoV-2, NAA: NOT DETECTED

## 2019-07-09 ENCOUNTER — Other Ambulatory Visit: Payer: Self-pay

## 2019-07-09 ENCOUNTER — Encounter (HOSPITAL_COMMUNITY): Payer: Self-pay

## 2019-07-09 ENCOUNTER — Emergency Department (HOSPITAL_COMMUNITY)
Admission: EM | Admit: 2019-07-09 | Discharge: 2019-07-09 | Disposition: A | Discharge status: Home / Self Care | Payer: Self-pay | Attending: Emergency Medicine | Admitting: Emergency Medicine

## 2019-07-09 DIAGNOSIS — J45901 Unspecified asthma with (acute) exacerbation: Secondary | ICD-10-CM | POA: Insufficient documentation

## 2019-07-09 DIAGNOSIS — F17210 Nicotine dependence, cigarettes, uncomplicated: Secondary | ICD-10-CM | POA: Insufficient documentation

## 2019-07-09 MED ORDER — PREDNISONE 20 MG PO TABS: 40.0000 mg | ORAL_TABLET | Freq: Every day | ORAL | 0 refills | Status: AC

## 2019-07-09 MED ORDER — PREDNISONE 20 MG PO TABS
40.0000 mg | ORAL_TABLET | Freq: Once | ORAL | Status: AC
  Administered 2019-07-09: 40 mg via ORAL
  Filled 2019-07-09: qty 2

## 2019-07-09 MED ORDER — ALBUTEROL SULFATE HFA 108 (90 BASE) MCG/ACT IN AERS
6.0000 | INHALATION_SPRAY | Freq: Once | RESPIRATORY_TRACT | Status: AC
  Administered 2019-07-09: 09:00:00 6 via RESPIRATORY_TRACT
  Filled 2019-07-09: qty 6.7

## 2019-07-09 NOTE — ED Provider Notes (Signed)
[facility]-EMERGENCY DEPT Provider Note   CSN: [id number] Arrival date & time: 07/09/19  0808     History   Chief Complaint Chief Complaint  Patient presents with  . Asthma    HPI Joshua Roach is a 23 y.o. male.     The history is provided by the patient.  Illness Location:  Chest Quality:  Wheezing Severity:  Mild Onset quality:  Gradual Duration:  3 days Timing:  Constant Progression:  Unchanged Chronicity:  Recurrent Context:  Hx of asthma, ran out of medications, smokes. Having some wheezing and chest tightness.  Relieved by:  Nothing Worsened by:  Nothing  Associated symptoms: chest pain and wheezing   Associated symptoms: no congestion, no fatigue, no fever and no sore throat     Past Medical History:  Diagnosis Date  . Asthma     There are no active problems to display for this patient.   History reviewed. No pertinent surgical history.      Home Medications    Prior to Admission medications   Medication Sig Start Date End Date Taking? Authorizing Provider  albuterol (VENTOLIN HFA) 108 (90 Base) MCG/ACT inhaler Inhale 2 puffs into the lungs every 6 (six) hours as needed for wheezing or shortness of breath. 05/26/19   Antony Madura, PA-C  predniSONE (DELTASONE) 20 MG tablet Take 2 tablets (40 mg total) by mouth daily for 4 days. 07/09/19 07/13/19  Virgina Norfolk, DO    Family History Family History  Problem Relation Age of Onset  . Healthy Mother     Social History Social History   Tobacco Use  . Smoking status: Former Smoker    Types: Cigarettes, Cigars  . Smokeless tobacco: Never Used  Substance Use Topics  . Alcohol use: Yes  . Drug use: Yes    Types: Marijuana    Comment: 2 times a week     Allergies   Patient has no known allergies.   Review of Systems Review of Systems  Constitutional: Negative for fatigue and fever.  HENT: Negative for congestion, sinus pain and sore throat.   Respiratory: Positive  for wheezing.   Cardiovascular: Positive for chest pain.     Physical Exam Updated Vital Signs BP (!) 144/81 (BP Location: Right Arm)   Pulse 61   Temp 98.2 F (36.8 C) (Oral)   Resp 16   Wt 93 kg   SpO2 99%   BMI 34.11 kg/m   Physical Exam Vitals signs and nursing note reviewed.  Constitutional:      General: He is not in acute distress.    Appearance: He is well-developed. He is not ill-appearing.  HENT:     Head: Normocephalic and atraumatic.  Eyes:     Conjunctiva/sclera: Conjunctivae normal.  Neck:     Musculoskeletal: Neck supple.  Cardiovascular:     Rate and Rhythm: Normal rate and regular rhythm.     Pulses: Normal pulses.     Heart sounds: Normal heart sounds. No murmur.  Pulmonary:     Effort: Pulmonary effort is normal. No respiratory distress.     Breath sounds: Wheezing present.  Abdominal:     Palpations: Abdomen is soft.     Tenderness: There is no abdominal tenderness.  Skin:    General: Skin is warm and dry.  Neurological:     Mental Status: He is alert.      ED Treatments / Results  Labs (all labs ordered are listed, but only abnormal results  are displayed) Labs Reviewed - No data to display  EKG None  Radiology No results found.  Procedures Procedures (including critical care time)  Medications Ordered in ED Medications  albuterol (VENTOLIN HFA) 108 (90 Base) MCG/ACT inhaler 6 puff (has no administration in time range)  predniSONE (DELTASONE) tablet 40 mg (has no administration in time range)     Initial Impression / Assessment and Plan / ED Course  I have reviewed the triage vital signs and the nursing notes.  Pertinent labs & imaging results that were available during my care of the patient were reviewed by me and considered in my medical decision making (see chart for details).        Joshua Roach is a 22 year old male who presents to the ED with wheezing.  Patient normal vitals.  No fever.  Patient states that he ran  out of his inhaler 3 days ago.  Has had some wheezing since.  Continues to smoke.  No infectious symptoms.  Overall appears well.  Has some mild wheezing on exam.  Given asthma inhaler and prednisone.  Likely mild exacerbation.  No concern for infectious issues at this time.  Likely secondary to smoking and not having his home medications.  Discharged in good condition.  Given return precautions.  This chart was dictated using voice recognition software.  Despite best efforts to proofread,  errors can occur which can change the documentation meaning.    Final Clinical Impressions(s) / ED Diagnoses   Final diagnoses:  Mild asthma with exacerbation, unspecified whether persistent    ED Discharge Orders         Ordered    predniSONE (DELTASONE) 20 MG tablet  Daily     07/09/19 [place], [place], DO 07/09/19 [id number]

## 2019-07-09 NOTE — ED Triage Notes (Signed)
Pt arrives via POV from work with complaints of asthma exacerbation. Pt reports being without his inhaler for 3 days. And reports acute asthma symptoms since.

## 2019-08-10 ENCOUNTER — Emergency Department (HOSPITAL_COMMUNITY)
Admission: EM | Admit: 2019-08-10 | Discharge: 2019-08-10 | Disposition: A | Discharge status: Home / Self Care | Payer: Self-pay | Attending: Emergency Medicine | Admitting: Emergency Medicine

## 2019-08-10 ENCOUNTER — Other Ambulatory Visit: Payer: Self-pay

## 2019-08-10 ENCOUNTER — Encounter (HOSPITAL_COMMUNITY): Payer: Self-pay | Admitting: *Deleted

## 2019-08-10 DIAGNOSIS — J45909 Unspecified asthma, uncomplicated: Secondary | ICD-10-CM | POA: Insufficient documentation

## 2019-08-10 DIAGNOSIS — J020 Streptococcal pharyngitis: Secondary | ICD-10-CM | POA: Insufficient documentation

## 2019-08-10 DIAGNOSIS — Z87891 Personal history of nicotine dependence: Secondary | ICD-10-CM | POA: Insufficient documentation

## 2019-08-10 DIAGNOSIS — Z79899 Other long term (current) drug therapy: Secondary | ICD-10-CM | POA: Insufficient documentation

## 2019-08-10 LAB — GROUP A STREP BY PCR: Group A Strep by PCR: DETECTED — AB

## 2019-08-10 MED ORDER — PENICILLIN G BENZATHINE 1200000 UNIT/2ML IM SUSP
1.2000 10*6.[IU] | Freq: Once | INTRAMUSCULAR | Status: AC
  Administered 2019-08-10: 14:00:00 1.2 10*6.[IU] via INTRAMUSCULAR
  Filled 2019-08-10: qty 2

## 2019-08-10 MED ORDER — DEXAMETHASONE SODIUM PHOSPHATE 10 MG/ML IJ SOLN
10.0000 mg | Freq: Once | INTRAMUSCULAR | Status: AC
  Administered 2019-08-10: 14:00:00 10 mg via INTRAMUSCULAR
  Filled 2019-08-10: qty 1

## 2019-08-10 MED ORDER — ACETAMINOPHEN 325 MG PO TABS
650.0000 mg | ORAL_TABLET | Freq: Once | ORAL | Status: AC | PRN
  Administered 2019-08-10: 12:00:00 650 mg via ORAL
  Filled 2019-08-10: qty 2

## 2019-08-10 NOTE — ED Triage Notes (Signed)
Tuesday started with a sore throat, swollen gland rt side, hurts to swallow. Pain to swallow, Fever started yesterday.

## 2019-08-10 NOTE — Discharge Instructions (Addendum)
You were seen in the emergency department and diagnosed with strep throat.  You were given a shot of Penicillin and a shot of Decadron.  - Penicillin is an antibiotic used to treat the infection - Decadron is a steroid used to treat the pain and swelling of your throat.   You should gradually feel better over the next few days. Take Tylenol and Ibuprofen for fever and pain. Follow up with your primary care provider in the next 1 week if you are not feeling better, if you do not have a primary care provider one is provided in your discharge instructions. Return to the emergency department for any new or worsening symptoms including but not limited to inability to open your mouth, inability to move your neck, worsening pain, change in your voice, inability to swallow your own saliva, drooling, or any other concerns.   Please also follow-up with your primary care provider for recheck of your blood pressure as it was elevated in the ER today.  Vitals:   08/10/19 1132  BP: (!) 160/89  Pulse: 99  Resp: 18  Temp: (!) 102.7 F (39.3 C)  SpO2: 95%

## 2019-08-10 NOTE — ED Provider Notes (Addendum)
[facility] DEPT Provider Note   CSN: [id number] Arrival date & time: 08/10/19  1123     History Chief Complaint  Patient presents with  . Sore Throat    Joshua Roach is a 23 y.o. male with a history of asthma who presents to the emergency department with complaints of sore throat over the past 4-5 days. Pain is constant, worse with swallowing but he is able to swallow, no alleviating factors. No intervention PTA. Was unaware of fever until ER arrival. Denies ear pain, congestion, neck stiffness, vomiting, dyspnea, or cough. No known sick contacts with similar sxs.   HPI     Past Medical History:  Diagnosis Date  . Asthma     There are no problems to display for this patient.   History reviewed. No pertinent surgical history.     Family History  Problem Relation Age of Onset  . Healthy Mother     Social History   Tobacco Use  . Smoking status: Former Smoker    Types: Cigarettes, Cigars  . Smokeless tobacco: Never Used  Substance Use Topics  . Alcohol use: Not Currently  . Drug use: Not Currently    Types: Marijuana    Comment: 2 times a week    Home Medications Prior to Admission medications   Medication Sig Start Date End Date Taking? Authorizing Provider  albuterol (VENTOLIN HFA) 108 (90 Base) MCG/ACT inhaler Inhale 2 puffs into the lungs every 6 (six) hours as needed for wheezing or shortness of breath. 05/26/19   Antonietta Breach, PA-C    Allergies    Patient has no known allergies.  Review of Systems   Review of Systems  Constitutional: Negative for chills and fever.  HENT: Positive for sore throat and trouble swallowing. Negative for congestion and facial swelling.   Respiratory: Negative for shortness of breath.   Cardiovascular: Negative for chest pain.  Gastrointestinal: Negative for abdominal pain and vomiting.  Musculoskeletal: Negative for neck stiffness.    Physical Exam Updated Vital Signs BP (!) 160/89 (BP  Location: Right Arm)   Pulse 99   Temp (!) 102.7 F (39.3 C) (Oral)   Resp 18   Ht 5\' 5"  (1.651 m)   Wt 93 kg   SpO2 95%   BMI 34.11 kg/m   Physical Exam Vitals and nursing note reviewed.  Constitutional:      General: He is not in acute distress.    Appearance: He is well-developed. He is not toxic-appearing.  HENT:     Head: Normocephalic and atraumatic.     Right Ear: Tympanic membrane and ear canal normal.     Left Ear: Tympanic membrane and ear canal normal.     Ears:     Comments: No mastoid erythema, swelling, or tenderness.    Nose: No congestion.     Comments: No sinus tenderness.    Mouth/Throat:     Pharynx: Uvula midline. Posterior oropharyngeal erythema present.     Tonsils: Tonsillar exudate present. 3+ on the right. 3+ on the left.     Comments: Posterior oropharynx is symmetric appearing. Patient tolerating own secretions without difficulty. No trismus. No drooling. No hot potato voice. No swelling beneath the tongue, submandibular compartment is soft.  Eyes:     General:        Right eye: No discharge.        Left eye: No discharge.     Conjunctiva/sclera: Conjunctivae normal.  Cardiovascular:  Rate and Rhythm: Normal rate and regular rhythm.  Pulmonary:     Effort: Pulmonary effort is normal. No respiratory distress.     Breath sounds: Normal breath sounds. No wheezing, rhonchi or rales.  Abdominal:     General: There is no distension.     Palpations: Abdomen is soft.     Tenderness: There is no abdominal tenderness.  Musculoskeletal:     Cervical back: Neck supple. No edema, rigidity or crepitus.  Lymphadenopathy:     Cervical: Cervical adenopathy present.  Skin:    General: Skin is warm and dry.     Findings: No rash.  Neurological:     Mental Status: He is alert.     Comments: Clear speech.   Psychiatric:        Behavior: Behavior normal.    ED Results / Procedures / Treatments   Labs (all labs ordered are listed, but only abnormal  results are displayed) Labs Reviewed  GROUP A STREP BY PCR - Abnormal; Notable for the following components:      Result Value   Group A Strep by PCR DETECTED (*)    All other components within normal limits    EKG None  Radiology No results found.  Procedures Procedures (including critical care time)  Medications Ordered in ED Medications  penicillin g benzathine (BICILLIN LA) 1200000 UNIT/2ML injection 1.2 Million Units (has no administration in time range)  dexamethasone (DECADRON) injection 10 mg (has no administration in time range)  acetaminophen (TYLENOL) tablet 650 mg (650 mg Oral Given 08/10/19 1141)    ED Course/MDM I have reviewed the triage vital signs and the nursing notes.  Pertinent labs & imaging results that were available during my care of the patient were reviewed by me and considered in my medical decision making (see chart for details).  Presents with complaint of sore throat.  Patient is nontoxic-appearing, vitals w/ fever and elevated BP- doubt HTN emergency. On exam patient with 3+ symmetric tonsillar erythema, exudates, and anterior cervical lymphadenopathy.  Strep test is positive.  Treated in the emergency department with IM Decadron and IM Bicillin.  Exam non concerning for PTA or RPA, there is no trismus, uvular deviation, or hot potato voice. Patient is tolerating his own secretions without difficulty, full ROM of the neck, submandibular compartment is soft. Recommended use of Tylenol and Ibuprofen for any continued discomfort or fevers. I discussed results, treatment plan, need for PCP follow-up, and return precautions with the patient. Provided opportunity for questions, patient confirmed understanding and is in agreement with plan.    Final Clinical Impression(s) / ED Diagnoses Final diagnoses:  Strep pharyngitis    Rx / DC Orders ED Discharge Orders    None       Cherly Anderson, PA-C 08/10/19 1327  Joshua Roach was evaluated in  Emergency Department on 08/10/2019 for the symptoms described in the history of present illness. He/she was evaluated in the context of the global COVID-19 pandemic, which necessitated consideration that the patient might be at risk for infection with the SARS-CoV-2 virus that causes COVID-19. Institutional protocols and algorithms that pertain to the evaluation of patients at risk for COVID-19 are in a state of rapid change based on information released by regulatory bodies including the CDC and federal and state organizations. These policies and algorithms were followed during the patient's care in the ED.     Desmond Lope 08/10/19 1328    Loren Racer, MD 08/10/19 1743

## 2019-08-13 ENCOUNTER — Encounter (HOSPITAL_COMMUNITY): Payer: Self-pay

## 2019-08-13 ENCOUNTER — Other Ambulatory Visit: Payer: Self-pay

## 2019-08-13 ENCOUNTER — Emergency Department (HOSPITAL_COMMUNITY)
Admission: EM | Admit: 2019-08-13 | Discharge: 2019-08-13 | Disposition: A | Discharge status: Home / Self Care | Payer: Self-pay | Attending: Emergency Medicine | Admitting: Emergency Medicine

## 2019-08-13 DIAGNOSIS — J45909 Unspecified asthma, uncomplicated: Secondary | ICD-10-CM | POA: Insufficient documentation

## 2019-08-13 DIAGNOSIS — Z79899 Other long term (current) drug therapy: Secondary | ICD-10-CM | POA: Insufficient documentation

## 2019-08-13 DIAGNOSIS — R112 Nausea with vomiting, unspecified: Secondary | ICD-10-CM

## 2019-08-13 DIAGNOSIS — Z87891 Personal history of nicotine dependence: Secondary | ICD-10-CM | POA: Insufficient documentation

## 2019-08-13 DIAGNOSIS — K920 Hematemesis: Secondary | ICD-10-CM | POA: Insufficient documentation

## 2019-08-13 LAB — COMPREHENSIVE METABOLIC PANEL
ALT: 26 U/L (ref 0–44)
AST: 18 U/L (ref 15–41)
Albumin: 4.4 g/dL (ref 3.5–5.0)
Alkaline Phosphatase: 77 U/L (ref 38–126)
Anion gap: 16 — ABNORMAL HIGH (ref 5–15)
BUN: 18 mg/dL (ref 6–20)
CO2: 26 mmol/L (ref 22–32)
Calcium: 9.4 mg/dL (ref 8.9–10.3)
Chloride: 98 mmol/L (ref 98–111)
Creatinine, Ser: 0.9 mg/dL (ref 0.61–1.24)
GFR calc Af Amer: >60 mL/min (ref >60)
GFR calc non Af Amer: >60 mL/min (ref >60)
Glucose, Bld: 91 mg/dL (ref 70–99)
Potassium: 4 mmol/L (ref 3.5–5.1)
Sodium: 140 mmol/L (ref 135–145)
Total Bilirubin: 1.7 mg/dL — ABNORMAL HIGH (ref 0.3–1.2)
Total Protein: 8.5 g/dL — ABNORMAL HIGH (ref 6.5–8.1)

## 2019-08-13 LAB — CBC WITH DIFFERENTIAL/PLATELET
Abs Immature Granulocytes: 0.05 10*3/uL (ref 0.00–0.07)
Basophils Absolute: 0.1 10*3/uL (ref 0.0–0.1)
Basophils Relative: 1 %
Eosinophils Absolute: 0 10*3/uL (ref 0.0–0.5)
Eosinophils Relative: 0 %
HCT: 50.2 % (ref 39.0–52.0)
Hemoglobin: 16.7 g/dL (ref 13.0–17.0)
Immature Granulocytes: 0 %
Lymphocytes Relative: 26 %
Lymphs Abs: 3 10*3/uL (ref 0.7–4.0)
MCH: 29.4 pg (ref 26.0–34.0)
MCHC: 33.3 g/dL (ref 30.0–36.0)
MCV: 88.4 fL (ref 80.0–100.0)
Monocytes Absolute: 0.9 10*3/uL (ref 0.1–1.0)
Monocytes Relative: 8 %
Neutro Abs: 7.4 10*3/uL (ref 1.7–7.7)
Neutrophils Relative %: 65 %
Platelets: 353 10*3/uL (ref 150–400)
RBC: 5.68 MIL/uL (ref 4.22–5.81)
RDW: 11.9 % (ref 11.5–15.5)
WBC: 11.5 10*3/uL — ABNORMAL HIGH (ref 4.0–10.5)
nRBC: 0 % (ref 0.0–0.2)

## 2019-08-13 LAB — LIPASE, BLOOD: Lipase: 20 U/L (ref 11–51)

## 2019-08-13 MED ORDER — SODIUM CHLORIDE 0.9 % IV BOLUS
1000.0000 mL | Freq: Once | INTRAVENOUS | Status: AC
  Administered 2019-08-13: 1000 mL via INTRAVENOUS

## 2019-08-13 MED ORDER — ONDANSETRON HCL 4 MG/2ML IJ SOLN
4.0000 mg | Freq: Once | INTRAMUSCULAR | Status: AC
  Administered 2019-08-13: 4 mg via INTRAVENOUS
  Filled 2019-08-13: qty 2

## 2019-08-13 MED ORDER — ONDANSETRON 4 MG PO TBDP: 4.0000 mg | ORAL_TABLET | Freq: Three times a day (TID) | ORAL | 0 refills | Status: DC | PRN

## 2019-08-13 MED ORDER — PANTOPRAZOLE SODIUM 40 MG PO TBEC: 40.0000 mg | DELAYED_RELEASE_TABLET | Freq: Every day | ORAL | 0 refills | Status: DC

## 2019-08-13 MED ORDER — PANTOPRAZOLE SODIUM 40 MG IV SOLR
40.0000 mg | Freq: Once | INTRAVENOUS | Status: AC
  Administered 2019-08-13: 20:00:00 40 mg via INTRAVENOUS
  Filled 2019-08-13: qty 40

## 2019-08-13 NOTE — ED Provider Notes (Signed)
[facility]-EMERGENCY DEPT Provider Note   CSN: [id number] Arrival date & time: 08/13/19  1445     History Chief Complaint  Patient presents with  . Hematemesis    Joshua Roach is a 23 y.o. male.  Presents to the emergency department with few days of nausea and vomiting.  Patient states he was seen in the emergency department 3 days ago for sore throat, states he received antibiotic and his sore throat has resolved, no ongoing symptoms of this.  However over the past few days he has had nausea and vomiting.  States vomiting episodes seem to worsen over the past couple days.  Today he noted small streaks of blood in some of his vomit.  No large chunks of blood, no clots.  No associated abdominal pain.  Endorses marijuana use, no other medical problems or drug problems.  HPI     Past Medical History:  Diagnosis Date  . Asthma     There are no problems to display for this patient.   History reviewed. No pertinent surgical history.     Family History  Problem Relation Age of Onset  . Healthy Mother     Social History   Tobacco Use  . Smoking status: Former Smoker    Types: Cigarettes, Cigars  . Smokeless tobacco: Never Used  Substance Use Topics  . Alcohol use: Not Currently  . Drug use: Not Currently    Types: Marijuana    Comment: 2 times a week    Home Medications Prior to Admission medications   Medication Sig Start Date End Date Taking? Authorizing Provider  acetaminophen (TYLENOL) 325 MG tablet Take 650 mg by mouth every 6 (six) hours as needed for mild pain.   Yes [provider]  albuterol (VENTOLIN HFA) 108 (90 Base) MCG/ACT inhaler Inhale 2 puffs into the lungs every 6 (six) hours as needed for wheezing or shortness of breath. 05/26/19  Yes Antony Madura, PA-C  ondansetron (ZOFRAN ODT) 4 MG disintegrating tablet Take 1 tablet (4 mg total) by mouth every 8 (eight) hours as needed for nausea or vomiting. 08/13/19   Milagros Loll, MD  pantoprazole (PROTONIX) 40 MG tablet Take 1 tablet (40 mg total) by mouth daily. 08/13/19 09/12/19  Milagros Loll, MD    Allergies    Patient has no known allergies.  Review of Systems   Review of Systems  Constitutional: Negative for chills and fever.  HENT: Negative for ear pain and sore throat.   Eyes: Negative for pain and visual disturbance.  Respiratory: Negative for cough and shortness of breath.   Cardiovascular: Negative for chest pain and palpitations.  Gastrointestinal: Positive for nausea and vomiting. Negative for abdominal pain.  Genitourinary: Negative for dysuria and hematuria.  Musculoskeletal: Negative for arthralgias and back pain.  Skin: Negative for color change and rash.  Neurological: Negative for seizures and syncope.  All other systems reviewed and are negative.   Physical Exam Updated Vital Signs BP (!) 144/88   Pulse 88   Temp 99 F (37.2 C) (Oral)   Resp 17   SpO2 100%   Physical Exam Vitals and nursing note reviewed.  Constitutional:      Appearance: He is well-developed.  HENT:     Head: Normocephalic and atraumatic.  Eyes:     Conjunctiva/sclera: Conjunctivae normal.  Cardiovascular:     Rate and Rhythm: Normal rate and regular rhythm.     Heart sounds: No murmur.  Pulmonary:  Effort: Pulmonary effort is normal. No respiratory distress.     Breath sounds: Normal breath sounds.  Abdominal:     General: Abdomen is flat. Bowel sounds are normal.     Palpations: Abdomen is soft.     Tenderness: There is no abdominal tenderness.  Musculoskeletal:     Cervical back: Neck supple.  Skin:    General: Skin is warm and dry.  Neurological:     General: No focal deficit present.     Mental Status: He is alert and oriented to person, place, and time.  Psychiatric:        Mood and Affect: Mood normal.        Behavior: Behavior normal.     ED Results / Procedures / Treatments   Labs (all labs ordered are listed, but  only abnormal results are displayed) Labs Reviewed  CBC WITH DIFFERENTIAL/PLATELET - Abnormal; Notable for the following components:      Result Value   WBC 11.5 (*)    All other components within normal limits  COMPREHENSIVE METABOLIC PANEL - Abnormal; Notable for the following components:   Total Protein 8.5 (*)    Total Bilirubin 1.7 (*)    Anion gap 16 (*)    All other components within normal limits  LIPASE, BLOOD    EKG None  Radiology No results found.  Procedures Procedures (including critical care time)  Medications Ordered in ED Medications  sodium chloride 0.9 % bolus 1,000 mL (0 mLs Intravenous Stopped 08/13/19 2131)  ondansetron (ZOFRAN) injection 4 mg (4 mg Intravenous Given 08/13/19 2003)  pantoprazole (PROTONIX) injection 40 mg (40 mg Intravenous Given 08/13/19 2001)    ED Course  I have reviewed the triage vital signs and the nursing notes.  Pertinent labs & imaging results that were available during my care of the patient were reviewed by me and considered in my medical decision making (see chart for details).  Clinical Course as of Aug 12 2329  Tue Aug 13, 2019  2010 Reviewed labs, will recheck patient, likely DC home   [RD]  2030 Recheck patient, symptoms improved, no further vomiting, will give p.o. trial, likely discharge   [RD]    Clinical Course User Index [RD] Lucrezia Starch, MD   MDM Rules/Calculators/A&P                      23 year old male with few day history of nausea and vomiting.  Today noted small streaks of blood in vomit.  On exam patient is very well-appearing, soft abdomen, no associated abdominal pain.  CBC, CMP are within normal limits.  Provided Protonix, Zofran and fluids.  On reassessment symptoms had resolved, tolerating p.o. without difficulty.  At this time I believe patient is appropriate for discharge and outpatient follow-up.  Provided information for gastroenterology as well as Rx for Zofran and PPI.  Reviewed  strict return precautions.    After the discussed management above, the patient was determined to be safe for discharge.  The patient was in agreement with this plan and all questions regarding their care were answered.  ED return precautions were discussed and the patient will return to the ED with any significant worsening of condition.     Final Clinical Impression(s) / ED Diagnoses Final diagnoses:  Hematemesis with nausea  Non-intractable vomiting with nausea, unspecified vomiting type    Rx / DC Orders ED Discharge Orders         Ordered  ondansetron (ZOFRAN ODT) 4 MG disintegrating tablet  Every 8 hours PRN     08/13/19 2037    pantoprazole (PROTONIX) 40 MG tablet  Daily     08/13/19 2037           Milagros Lollykstra, Hailey Stormer S, MD 08/13/19 2334

## 2019-08-13 NOTE — ED Triage Notes (Signed)
Pt presents with c/o hematemesis. Pt reports he was diagnosed with strep throat 3 days ago and yesterday started vomiting blood.

## 2019-08-13 NOTE — Discharge Instructions (Signed)
Please take the prescribed medications as instructed.  If you worsening vomiting, any further blood in vomit, abdominal pain, other new concerning symptom, please return to ER for reassessment.  Otherwise recommend follow-up with gastroenterology as well as your primary care doctor.

## 2019-08-20 ENCOUNTER — Other Ambulatory Visit: Payer: Self-pay

## 2019-08-20 ENCOUNTER — Ambulatory Visit: Payer: Self-pay | Attending: Internal Medicine | Admitting: Internal Medicine

## 2019-09-04 ENCOUNTER — Ambulatory Visit: Payer: Self-pay | Admitting: Gastroenterology

## 2019-09-27 ENCOUNTER — Other Ambulatory Visit: Payer: Self-pay

## 2019-09-27 ENCOUNTER — Encounter (HOSPITAL_COMMUNITY): Payer: Self-pay

## 2019-09-27 ENCOUNTER — Emergency Department (HOSPITAL_COMMUNITY)
Admission: EM | Admit: 2019-09-27 | Discharge: 2019-09-27 | Disposition: A | Discharge status: Home / Self Care | Payer: Self-pay | Attending: Emergency Medicine | Admitting: Emergency Medicine

## 2019-09-27 DIAGNOSIS — Z87891 Personal history of nicotine dependence: Secondary | ICD-10-CM | POA: Insufficient documentation

## 2019-09-27 DIAGNOSIS — Z760 Encounter for issue of repeat prescription: Secondary | ICD-10-CM | POA: Insufficient documentation

## 2019-09-27 DIAGNOSIS — J45909 Unspecified asthma, uncomplicated: Secondary | ICD-10-CM | POA: Insufficient documentation

## 2019-09-27 MED ORDER — ALBUTEROL SULFATE HFA 108 (90 BASE) MCG/ACT IN AERS
2.0000 | INHALATION_SPRAY | Freq: Once | RESPIRATORY_TRACT | Status: AC
  Administered 2019-09-27: 2 via RESPIRATORY_TRACT
  Filled 2019-09-27: qty 6.7
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
## 2019-09-27 NOTE — ED Notes (Signed)
Pt verbalizes understanding of DC instructions. Pt belongings returned and is ambulatory out of ED.  
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
## 2019-09-27 NOTE — Discharge Instructions (Addendum)
You were seen in the ER today for a refill of your inhaler. This was provided. Please use 1-2 puffs ever 4-6 hours as needed for wheezing and or trouble breathing.   Please follow up with primary care and or establish with a primary care provider within the next 1-2 weeks. Return to the ED for new or worsening symptoms including but not limited to trouble breathing/wheezing (especially not alleviated by inhaler) fever, cough, passing out, chest pain or any other concerns.

## 2019-10-26 ENCOUNTER — Encounter (HOSPITAL_COMMUNITY): Payer: Self-pay

## 2019-10-26 ENCOUNTER — Emergency Department (HOSPITAL_COMMUNITY)
Admission: EM | Admit: 2019-10-26 | Discharge: 2019-10-26 | Disposition: A | Discharge status: Home / Self Care | Payer: Self-pay | Attending: Emergency Medicine | Admitting: Emergency Medicine

## 2019-10-26 ENCOUNTER — Other Ambulatory Visit: Payer: Self-pay

## 2019-10-26 DIAGNOSIS — Z87891 Personal history of nicotine dependence: Secondary | ICD-10-CM | POA: Insufficient documentation

## 2019-10-26 DIAGNOSIS — Z760 Encounter for issue of repeat prescription: Secondary | ICD-10-CM | POA: Insufficient documentation

## 2019-10-26 DIAGNOSIS — J4520 Mild intermittent asthma, uncomplicated: Secondary | ICD-10-CM | POA: Insufficient documentation

## 2019-10-26 MED ORDER — ALBUTEROL SULFATE HFA 108 (90 BASE) MCG/ACT IN AERS
2.0000 | INHALATION_SPRAY | RESPIRATORY_TRACT | Status: DC | PRN
  Administered 2019-10-26: 2 via RESPIRATORY_TRACT
  Filled 2019-10-26: qty 6.7

## 2019-10-26 MED ORDER — ALBUTEROL SULFATE HFA 108 (90 BASE) MCG/ACT IN AERS: 2.0000 | INHALATION_SPRAY | RESPIRATORY_TRACT | 0 refills | Status: DC | PRN
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
## 2019-10-26 NOTE — ED Provider Notes (Signed)
[facility]-EMERGENCY DEPT Provider Note   CSN: [id number] Arrival date & time: 10/26/19  4163     History Chief Complaint  Patient presents with  . Asthma    Joshua Roach is a 24 y.o. male.  HPI Patient presents to the emergency department with request for refill of his inhaler.  The patient states he has been out of inhaler for several weeks.  He states that he has been using his brothers inhaler.  The patient states that he is not having a current asthma exacerbation.  But does like to see his doctor for the next few weeks.  Patient states that mainly happens while he is exerting himself at work.  The patient denies chest pain, shortness of breath, headache,blurred vision, neck pain, fever, cough, weakness, numbness, dizziness, anorexia, edema, abdominal pain, nausea, vomiting, diarrhea, rash, back pain, dysuria, hematemesis, bloody stool, near syncope, or syncope.    Past Medical History:  Diagnosis Date  . Asthma     There are no problems to display for this patient.   History reviewed. No pertinent surgical history.     Family History  Problem Relation Age of Onset  . Healthy Mother     Social History   Tobacco Use  . Smoking status: Former Smoker    Types: Cigarettes, Cigars  . Smokeless tobacco: Never Used  Substance Use Topics  . Alcohol use: Not Currently  . Drug use: Not Currently    Types: Marijuana    Comment: 2 times a week    Home Medications Prior to Admission medications   Medication Sig Start Date End Date Taking? Authorizing Provider  acetaminophen (TYLENOL) 325 MG tablet Take 650 mg by mouth every 6 (six) hours as needed for mild pain.    [provider]  albuterol (VENTOLIN HFA) 108 (90 Base) MCG/ACT inhaler Inhale 2 puffs into the lungs every 6 (six) hours as needed for wheezing or shortness of breath. 05/26/19   Antony Madura, PA-C  ondansetron (ZOFRAN ODT) 4 MG disintegrating tablet Take 1 tablet (4 mg total) by  mouth every 8 (eight) hours as needed for nausea or vomiting. 08/13/19   Milagros Loll, MD  pantoprazole (PROTONIX) 40 MG tablet Take 1 tablet (40 mg total) by mouth daily. 08/13/19 09/12/19  Milagros Loll, MD    Allergies    Patient has no known allergies.  Review of Systems   Review of Systems All other systems negative except as documented in the HPI. All pertinent positives and negatives as reviewed in the HPI. Physical Exam Updated Vital Signs BP 133/80 (BP Location: Left Arm)   Pulse 65   Temp 98.5 F (36.9 C) (Oral)   Resp 18   SpO2 96%   Physical Exam Vitals and nursing note reviewed.  Constitutional:      General: He is not in acute distress.    Appearance: He is well-developed.  HENT:     Head: Normocephalic and atraumatic.  Eyes:     Pupils: Pupils are equal, round, and reactive to light.  Cardiovascular:     Rate and Rhythm: Normal rate and regular rhythm.     Heart sounds: Normal heart sounds. No murmur. No friction rub. No gallop.   Pulmonary:     Effort: Pulmonary effort is normal. No respiratory distress.     Breath sounds: Normal breath sounds. No wheezing or rhonchi.  Musculoskeletal:     Cervical back: Normal range of motion and neck supple.  Skin:  General: Skin is warm and dry.     Capillary Refill: Capillary refill takes less than 2 seconds.     Findings: No erythema or rash.  Neurological:     Mental Status: He is alert and oriented to person, place, and time.     Motor: No abnormal muscle tone.     Coordination: Coordination normal.  Psychiatric:        Behavior: Behavior normal.     ED Results / Procedures / Treatments   Labs (all labs ordered are listed, but only abnormal results are displayed) Labs Reviewed - No data to display  EKG None  Radiology No results found.  Procedures Procedures (including critical care time)  Medications Ordered in ED Medications - No data to display  ED Course  I have reviewed the  triage vital signs and the nursing notes.  Pertinent labs & imaging results that were available during my care of the patient were reviewed by me and considered in my medical decision making (see chart for details).    MDM Rules/Calculators/A&P                      Patient will be given a refill on his inhaler.  Have advised him to return here for any worsening in his condition.  Patient agrees this plan all questions were answered. Final Clinical Impression(s) / ED Diagnoses Final diagnoses:  None    Rx / DC Orders ED Discharge Orders    None       Dalia Heading, PA-C 10/26/19 [place], Wenda Overland, MD 10/26/19 [phone]

## 2019-10-26 NOTE — Discharge Instructions (Addendum)
Return here as needed.  Follow-up with your primary doctor as soon as possible. °

## 2019-11-22 ENCOUNTER — Other Ambulatory Visit: Payer: Self-pay

## 2019-11-22 ENCOUNTER — Emergency Department (HOSPITAL_COMMUNITY): Payer: Self-pay

## 2019-11-22 ENCOUNTER — Emergency Department (HOSPITAL_COMMUNITY)
Admission: EM | Admit: 2019-11-22 | Discharge: 2019-11-22 | Disposition: A | Discharge status: Home / Self Care | Payer: Self-pay | Attending: Emergency Medicine | Admitting: Emergency Medicine

## 2019-11-22 DIAGNOSIS — Z87891 Personal history of nicotine dependence: Secondary | ICD-10-CM | POA: Insufficient documentation

## 2019-11-22 DIAGNOSIS — J4541 Moderate persistent asthma with (acute) exacerbation: Secondary | ICD-10-CM | POA: Insufficient documentation

## 2019-11-22 DIAGNOSIS — R0789 Other chest pain: Secondary | ICD-10-CM | POA: Insufficient documentation

## 2019-11-22 MED ORDER — PREDNISONE 20 MG PO TABS
60.0000 mg | ORAL_TABLET | Freq: Once | ORAL | Status: AC
  Administered 2019-11-22: 09:00:00 60 mg via ORAL
  Filled 2019-11-22: qty 3

## 2019-11-22 MED ORDER — ALBUTEROL SULFATE HFA 108 (90 BASE) MCG/ACT IN AERS
2.0000 | INHALATION_SPRAY | Freq: Once | RESPIRATORY_TRACT | Status: AC
  Administered 2019-11-22: 2 via RESPIRATORY_TRACT
  Filled 2019-11-22: qty 6.7

## 2019-11-22 MED ORDER — IPRATROPIUM BROMIDE HFA 17 MCG/ACT IN AERS
2.0000 | INHALATION_SPRAY | Freq: Once | RESPIRATORY_TRACT | Status: AC
  Administered 2019-11-22: 09:00:00 2 via RESPIRATORY_TRACT
  Filled 2019-11-22: qty 12.9

## 2019-11-22 MED ORDER — PREDNISONE 10 MG (21) PO TBPK: ORAL_TABLET | Freq: Every day | ORAL | 0 refills | Status: DC

## 2019-11-22 NOTE — ED Provider Notes (Signed)
[facility]-EMERGENCY DEPT Provider Note   CSN: [id number] Arrival date & time: 11/22/19  0818     History Chief Complaint  Patient presents with  . Shortness of Breath    Joshua Roach is a 24 y.o. male with a past medical history of asthma presenting to the ED with a chief complaint of shortness of breath related to an asthma flareup.  Woke up this morning with wheezing and shortness of breath and feeling like "I can't get a full breath in."  He ran out of his inhaler about 1 week ago.  He states that this feels similar to his prior asthma flareups.  He does not use a daily inhaler and is not yet followed by a PCP due to his work schedule.  He denies any leg swelling, sick contacts with similar symptoms, productive cough, fever, abdominal pain, vomiting. He has been suffering from asthma since childhood but denies any prior intubations related to asthma.  HPI     Past Medical History:  Diagnosis Date  . Asthma     There are no problems to display for this patient.   No past surgical history on file.     Family History  Problem Relation Age of Onset  . Healthy Mother     Social History   Tobacco Use  . Smoking status: Former Smoker    Types: Cigarettes, Cigars  . Smokeless tobacco: Never Used  Substance Use Topics  . Alcohol use: Not Currently  . Drug use: Not Currently    Types: Marijuana    Comment: 2 times a week    Home Medications Prior to Admission medications   Medication Sig Start Date End Date Taking? Authorizing Provider  acetaminophen (TYLENOL) 325 MG tablet Take 650 mg by mouth every 6 (six) hours as needed for mild pain.    [provider]  albuterol (VENTOLIN HFA) 108 (90 Base) MCG/ACT inhaler Inhale 2 puffs into the lungs every 4 (four) hours as needed for wheezing or shortness of breath. 10/26/19   Lawyer, Cristal Deer, PA-C  ondansetron (ZOFRAN ODT) 4 MG disintegrating tablet Take 1 tablet (4 mg total) by mouth every  8 (eight) hours as needed for nausea or vomiting. 08/13/19   Milagros Loll, MD  pantoprazole (PROTONIX) 40 MG tablet Take 1 tablet (40 mg total) by mouth daily. 08/13/19 09/12/19  Milagros Loll, MD  predniSONE (STERAPRED UNI-PAK 21 TAB) 10 MG (21) TBPK tablet Take by mouth daily. Take 6 tabs by mouth daily  for 2 days, then 5 tabs for 2 days, then 4 tabs for 2 days, then 3 tabs for 2 days, 2 tabs for 2 days, then 1 tab by mouth daily for 2 days 11/22/19   Dietrich Pates, PA-C    Allergies    Patient has no known allergies.  Review of Systems   Review of Systems  Constitutional: Negative for appetite change, chills and fever.  HENT: Negative for ear pain, rhinorrhea, sneezing and sore throat.   Eyes: Negative for photophobia and visual disturbance.  Respiratory: Positive for chest tightness, shortness of breath and wheezing. Negative for cough.   Cardiovascular: Negative for chest pain and palpitations.  Gastrointestinal: Negative for abdominal pain, blood in stool, constipation, diarrhea, nausea and vomiting.  Genitourinary: Negative for dysuria, hematuria and urgency.  Musculoskeletal: Negative for myalgias.  Skin: Negative for rash.  Neurological: Negative for dizziness, weakness and light-headedness.    Physical Exam Updated Vital Signs BP 128/78   Pulse  74   Temp 98.2 F (36.8 C) (Oral)   Resp 18   Ht 5\' 5"  (1.651 m)   Wt 77.1 kg   SpO2 98%   BMI 28.29 kg/m   Physical Exam Vitals and nursing note reviewed.  Constitutional:      General: He is not in acute distress.    Appearance: He is well-developed.     Comments: Speaking in complete sentences without difficulty.  HENT:     Head: Normocephalic and atraumatic.     Nose: Nose normal.  Eyes:     General: No scleral icterus.       Left eye: No discharge.     Conjunctiva/sclera: Conjunctivae normal.  Cardiovascular:     Rate and Rhythm: Normal rate and regular rhythm.     Heart sounds: Normal heart sounds. No  murmur. No friction rub. No gallop.   Pulmonary:     Effort: Pulmonary effort is normal. No respiratory distress.     Breath sounds: Wheezing present.     Comments: Slight wheezing noted bilaterally. Abdominal:     General: Bowel sounds are normal. There is no distension.     Palpations: Abdomen is soft.     Tenderness: There is no abdominal tenderness. There is no guarding.  Musculoskeletal:        General: Normal range of motion.     Cervical back: Normal range of motion and neck supple.     Right lower leg: No edema.     Left lower leg: No edema.  Skin:    General: Skin is warm and dry.     Findings: No rash.  Neurological:     Mental Status: He is alert.     Motor: No abnormal muscle tone.     Coordination: Coordination normal.     ED Results / Procedures / Treatments   Labs (all labs ordered are listed, but only abnormal results are displayed) Labs Reviewed - No data to display  EKG None  Radiology DG Chest Portable 1 View  Result Date: 11/22/2019 CLINICAL DATA:  Asthma. EXAM: PORTABLE CHEST 1 VIEW COMPARISON:  Chest radiograph 07/02/2018 FINDINGS: Heart size within normal limits. No evidence of airspace consolidation within the lungs. No evidence of pleural effusion or pneumothorax. No acute bony abnormality. IMPRESSION: No evidence of acute cardiopulmonary abnormality. Electronically Signed   By: 13/11/2017 DO   On: 11/22/2019 09:03    Procedures Procedures (including critical care time)  Medications Ordered in ED Medications  albuterol (VENTOLIN HFA) 108 (90 Base) MCG/ACT inhaler 2 puff (2 puffs Inhalation Given 11/22/19 0851)  ipratropium (ATROVENT HFA) inhaler 2 puff (2 puffs Inhalation Given 11/22/19 0851)  predniSONE (DELTASONE) tablet 60 mg (60 mg Oral Given 11/22/19 11/24/19)    ED Course  I have reviewed the triage vital signs and the nursing notes.  Pertinent labs & imaging results that were available during my care of the patient were reviewed by me and  considered in my medical decision making (see chart for details).    MDM Rules/Calculators/A&P                      24 year old male with a past medical history of asthma with numerous visits in the ER for similar flareups of asthma presenting to the ED for shortness of breath and chest tightness that began this morning.  He states that this feels similar to his prior asthma flareups.  Ran out of his inhaler about 1 week  ago. On exam there is some slight wheezing noted in bilateral lungs.  He is speaking complete sentences without signs of respiratory distress.  Oxygen saturations above 98% on room air.  Will attempt to give albuterol, ipratropium, prednisone and get chest x-ray. Will reassess after this.  Chest x-ray without any acute findings.  Patient reports significant improvement in symptoms with inhalers and steroids given here. Repeat lung exam with improvement in wheezing. He is requesting discharge. Stressed importance of establishing care with a primary care provider to continue with his medications.  Will give albuterol to take at home, steroid course and PCP follow-up.  Suspect that his symptoms are most likely due to asthma exacerbation that has worsened after environmental factors.  Doubt ACS, PE, pneumothorax or other emergent cause of symptoms based on his improvement and history.  Patient is hemodynamically stable, in NAD, and able to ambulate in the ED. Evaluation does not show pathology that would require ongoing emergent intervention or inpatient treatment. I have personally reviewed and interpreted all lab work and imaging at today's ED visit. I explained the diagnosis to the patient. Pain has been managed and has no complaints prior to discharge. Patient is comfortable with above plan and is stable for discharge at this time. All questions were answered prior to disposition. Strict return precautions for returning to the ED were discussed. Encouraged follow up with PCP.   An After  Visit Summary was printed and given to the patient.   Portions of this note were generated with [employer]. Dictation errors may occur despite best attempts at proofreading.   Final Clinical Impression(s) / ED Diagnoses Final diagnoses:  Moderate persistent asthma with acute exacerbation    Rx / DC Orders ED Discharge Orders         Ordered    predniSONE (STERAPRED UNI-PAK 21 TAB) 10 MG (21) TBPK tablet  Daily     11/22/19 0943           Delia Heady, PA-C 11/22/19 0945    Sherwood Gambler, MD 11/22/19 1122

## 2019-11-22 NOTE — Discharge Instructions (Addendum)
Continue taking the steroids starting tomorrow. Use the inhalers as directed. Return to the ER if you start to experience worsening chest pain, wheezing, shortness of breath or leg swelling.

## 2019-11-22 NOTE — ED Triage Notes (Signed)
24 yo male c/o SHOB since waking up this morning. Ran out of his inhaler last week. Denies chest pain. Speaking in full sentences. A/ox3. Hx of asthma.

## 2019-12-29 ENCOUNTER — Other Ambulatory Visit: Payer: Self-pay

## 2019-12-29 ENCOUNTER — Emergency Department (HOSPITAL_COMMUNITY)
Admission: EM | Admit: 2019-12-29 | Discharge: 2019-12-29 | Disposition: A | Discharge status: Home / Self Care | Payer: Self-pay | Attending: Emergency Medicine | Admitting: Emergency Medicine

## 2019-12-29 ENCOUNTER — Encounter (HOSPITAL_COMMUNITY): Payer: Self-pay

## 2019-12-29 DIAGNOSIS — J4521 Mild intermittent asthma with (acute) exacerbation: Secondary | ICD-10-CM | POA: Insufficient documentation

## 2019-12-29 MED ORDER — ALBUTEROL SULFATE HFA 108 (90 BASE) MCG/ACT IN AERS
2.0000 | INHALATION_SPRAY | Freq: Once | RESPIRATORY_TRACT | Status: AC
  Administered 2019-12-29: 2 via RESPIRATORY_TRACT
  Filled 2019-12-29: qty 6.7

## 2019-12-29 NOTE — ED Triage Notes (Signed)
Pt arrived ambulatory into ED [facility] refill for inhaler. Pt states " I was out last night and lost mine".   Pt denies SOB wheezing or other difficulty breathing. Pt reports "my chest feels kind of tight sometimes but I just need an inhaler" .  VSS Afebrile. No audible wheezing or signs of distress

## 2019-12-29 NOTE — ED Provider Notes (Signed)
[facility]-EMERGENCY DEPT Provider Note   CSN: [id number] Arrival date & time: 12/29/19  1914     History Chief Complaint  Patient presents with  . Medication Refill    Joshua Roach is a 24 y.o. male.  23 yo M with a cc of shortness of breath.  This has been going on for the past day.  Patient states that he went out heavy for his birthday last night and lost his inhaler.  States that he has been somewhat short of breath and feeling like he needs the inhaler.  Has coughed a few times.  No fevers.  The history is provided by the patient.  Medication Refill Illness Severity:  Moderate Onset quality:  Gradual Duration:  2 days Timing:  Constant Progression:  Worsening Chronicity:  New Associated symptoms: shortness of breath and wheezing   Associated symptoms: no abdominal pain, no chest pain, no congestion, no cough, no diarrhea, no fever, no headaches, no myalgias, no rash and no vomiting        Past Medical History:  Diagnosis Date  . Asthma     There are no problems to display for this patient.   History reviewed. No pertinent surgical history.     Family History  Problem Relation Age of Onset  . Healthy Mother     Social History   Tobacco Use  . Smoking status: Former Smoker    Types: Cigarettes, Cigars  . Smokeless tobacco: Never Used  Substance Use Topics  . Alcohol use: Not Currently  . Drug use: Not Currently    Types: Marijuana    Comment: 2 times a week    Home Medications Prior to Admission medications   Medication Sig Start Date End Date Taking? Authorizing Provider  acetaminophen (TYLENOL) 325 MG tablet Take 650 mg by mouth every 6 (six) hours as needed for mild pain.    [provider]  albuterol (VENTOLIN HFA) 108 (90 Base) MCG/ACT inhaler Inhale 2 puffs into the lungs every 4 (four) hours as needed for wheezing or shortness of breath. 10/26/19   Lawyer, Cristal Deer, PA-C  ondansetron (ZOFRAN ODT) 4 MG  disintegrating tablet Take 1 tablet (4 mg total) by mouth every 8 (eight) hours as needed for nausea or vomiting. 08/13/19   Milagros Loll, MD  pantoprazole (PROTONIX) 40 MG tablet Take 1 tablet (40 mg total) by mouth daily. 08/13/19 09/12/19  Milagros Loll, MD  predniSONE (STERAPRED UNI-PAK 21 TAB) 10 MG (21) TBPK tablet Take by mouth daily. Take 6 tabs by mouth daily  for 2 days, then 5 tabs for 2 days, then 4 tabs for 2 days, then 3 tabs for 2 days, 2 tabs for 2 days, then 1 tab by mouth daily for 2 days 11/22/19   Dietrich Pates, PA-C    Allergies    Patient has no known allergies.  Review of Systems   Review of Systems  Constitutional: Negative for chills and fever.  HENT: Negative for congestion and facial swelling.   Eyes: Negative for discharge and visual disturbance.  Respiratory: Positive for shortness of breath and wheezing. Negative for cough.   Cardiovascular: Negative for chest pain and palpitations.  Gastrointestinal: Negative for abdominal pain, diarrhea and vomiting.  Musculoskeletal: Negative for arthralgias and myalgias.  Skin: Negative for color change and rash.  Neurological: Negative for tremors, syncope and headaches.  Psychiatric/Behavioral: Negative for confusion and dysphoric mood.    Physical Exam Updated Vital Signs BP (!) 171/88 (BP  Location: Right Arm)   Pulse 69   Temp 98.3 F (36.8 C) (Oral)   Resp 16   Ht 5\' 5"  (1.651 m)   Wt 83.9 kg   SpO2 99%   BMI 30.79 kg/m   Physical Exam Vitals and nursing note reviewed.  Constitutional:      Appearance: He is well-developed.  HENT:     Head: Normocephalic and atraumatic.  Eyes:     Pupils: Pupils are equal, round, and reactive to light.  Neck:     Vascular: No JVD.  Cardiovascular:     Rate and Rhythm: Normal rate and regular rhythm.     Heart sounds: No murmur. No friction rub. No gallop.   Pulmonary:     Effort: No respiratory distress.     Breath sounds: No wheezing.  Abdominal:      General: There is no distension.     Tenderness: There is no abdominal tenderness. There is no guarding or rebound.  Musculoskeletal:        General: Normal range of motion.     Cervical back: Normal range of motion and neck supple.  Skin:    Coloration: Skin is not pale.     Findings: No rash.  Neurological:     Mental Status: He is alert and oriented to person, place, and time.  Psychiatric:        Behavior: Behavior normal.     ED Results / Procedures / Treatments   Labs (all labs ordered are listed, but only abnormal results are displayed) Labs Reviewed - No data to display  EKG None  Radiology No results found.  Procedures Procedures (including critical care time)  Medications Ordered in ED Medications  albuterol (VENTOLIN HFA) 108 (90 Base) MCG/ACT inhaler 2 puff (2 puffs Inhalation Given 12/29/19 1942)    ED Course  I have reviewed the triage vital signs and the nursing notes.  Pertinent labs & imaging results that were available during my care of the patient were reviewed by me and considered in my medical decision making (see chart for details).    MDM Rules/Calculators/A&P                      23 yo M with a chief complaints of shortness of breath.  Feels like his asthma.  Is run out of his inhaler.  Given 2 puffs of albuterol with complete resolution of his symptoms.  Clear lungs on my exam.  Discharge home.  8:12 PM:  I have discussed the diagnosis/risks/treatment options with the patient and believe the pt to be eligible for discharge home to follow-up with PCP. We also discussed returning to the ED immediately if new or worsening sx occur. We discussed the sx which are most concerning (e.g., sudden worsening pain, fever, inability to tolerate by mouth) that necessitate immediate return. Medications administered to the patient during their visit and any new prescriptions provided to the patient are listed below.  Medications given during this  visit Medications  albuterol (VENTOLIN HFA) 108 (90 Base) MCG/ACT inhaler 2 puff (2 puffs Inhalation Given 12/29/19 1942)     The patient appears reasonably screen and/or stabilized for discharge and I doubt any other medical condition or other [facility] requiring further screening, evaluation, or treatment in the ED at this time prior to discharge.   Final Clinical Impression(s) / ED Diagnoses Final diagnoses:  Mild intermittent asthma with acute exacerbation    Rx / DC Orders ED Discharge Orders  None       Melene Plan, DO 12/29/19 2012

## 2019-12-29 NOTE — ED Notes (Signed)
Pt verbalizes understanding of DC instructions. Pt belongings returned and is ambulatory out of ED.  

## 2019-12-29 NOTE — Discharge Instructions (Signed)
Return for worsening shortness of breath or refusing to use your inhaler more often than every 4 hours.

## 2020-02-02 ENCOUNTER — Encounter (HOSPITAL_COMMUNITY): Payer: Self-pay | Admitting: Emergency Medicine

## 2020-02-02 ENCOUNTER — Emergency Department (HOSPITAL_COMMUNITY)
Admission: EM | Admit: 2020-02-02 | Discharge: 2020-02-02 | Disposition: A | Discharge status: Home / Self Care | Payer: Self-pay | Attending: Emergency Medicine | Admitting: Emergency Medicine

## 2020-02-02 ENCOUNTER — Other Ambulatory Visit: Payer: Self-pay

## 2020-02-02 DIAGNOSIS — Z87891 Personal history of nicotine dependence: Secondary | ICD-10-CM | POA: Insufficient documentation

## 2020-02-02 DIAGNOSIS — J45909 Unspecified asthma, uncomplicated: Secondary | ICD-10-CM | POA: Insufficient documentation

## 2020-02-02 DIAGNOSIS — Z760 Encounter for issue of repeat prescription: Secondary | ICD-10-CM | POA: Insufficient documentation

## 2020-02-02 MED ORDER — ALBUTEROL SULFATE HFA 108 (90 BASE) MCG/ACT IN AERS
2.0000 | INHALATION_SPRAY | Freq: Once | RESPIRATORY_TRACT | Status: AC
  Administered 2020-02-02: 2 via RESPIRATORY_TRACT
  Filled 2020-02-02: qty 6.7

## 2020-02-02 MED ORDER — AEROCHAMBER PLUS FLO-VU MEDIUM MISC
1.0000 | Freq: Once | Status: AC
  Administered 2020-02-02: 1
  Filled 2020-02-02: qty 1

## 2020-02-02 MED ORDER — ALBUTEROL SULFATE HFA 108 (90 BASE) MCG/ACT IN AERS: 2.0000 | INHALATION_SPRAY | RESPIRATORY_TRACT | 0 refills | Status: DC | PRN

## 2020-02-02 NOTE — ED Provider Notes (Signed)
[facility] DEPT Provider Note   CSN: [id number] Arrival date & time: 02/02/20  1052     History Chief Complaint  Patient presents with  . Medication Refill    Joshua Roach is a 24 y.o. male.  Presents to ER with concern for medication refill.  States last night and this morning he has had some mild wheezes.  Reports he intermittently has similar symptoms that normally resolves with an inhaler however he ran out of his inhaler and is requesting a refill.  Reports long history of asthma, worse with seasonal allergies, does not take any controlling meds, only as needed albuterol.  Denies any chest pain, no difficulty in breathing, no fevers, no cough.  HPI     Past Medical History:  Diagnosis Date  . Asthma     There are no problems to display for this patient.   History reviewed. No pertinent surgical history.     Family History  Problem Relation Age of Onset  . Healthy Mother     Social History   Tobacco Use  . Smoking status: Former Smoker    Types: Cigarettes, Cigars  . Smokeless tobacco: Never Used  Substance Use Topics  . Alcohol use: Not Currently  . Drug use: Not Currently    Types: Marijuana    Comment: 2 times a week    Home Medications Prior to Admission medications   Medication Sig Start Date End Date Taking? Authorizing Provider  acetaminophen (TYLENOL) 325 MG tablet Take 650 mg by mouth every 6 (six) hours as needed for mild pain.    [provider]  albuterol (VENTOLIN HFA) 108 (90 Base) MCG/ACT inhaler Inhale 2 puffs into the lungs every 4 (four) hours as needed for wheezing or shortness of breath. 02/02/20   Lucrezia Starch, MD  ondansetron (ZOFRAN ODT) 4 MG disintegrating tablet Take 1 tablet (4 mg total) by mouth every 8 (eight) hours as needed for nausea or vomiting. 08/13/19   Lucrezia Starch, MD  pantoprazole (PROTONIX) 40 MG tablet Take 1 tablet (40 mg total) by mouth daily. 08/13/19 09/12/19   Lucrezia Starch, MD  predniSONE (STERAPRED UNI-PAK 21 TAB) 10 MG (21) TBPK tablet Take by mouth daily. Take 6 tabs by mouth daily  for 2 days, then 5 tabs for 2 days, then 4 tabs for 2 days, then 3 tabs for 2 days, 2 tabs for 2 days, then 1 tab by mouth daily for 2 days 11/22/19   Delia Heady, PA-C    Allergies    Patient has no known allergies.  Review of Systems   Review of Systems  Constitutional: Negative for chills and fever.  HENT: Negative for ear pain and sore throat.   Eyes: Negative for pain and visual disturbance.  Respiratory: Positive for shortness of breath and wheezing. Negative for cough.   Cardiovascular: Negative for chest pain and palpitations.  Gastrointestinal: Negative for abdominal pain and vomiting.  Genitourinary: Negative for dysuria and hematuria.  Musculoskeletal: Negative for arthralgias and back pain.  Skin: Negative for color change and rash.  Neurological: Negative for seizures and syncope.  All other systems reviewed and are negative.   Physical Exam Updated Vital Signs BP 136/80   Pulse 64   Temp 98.3 F (36.8 C) (Oral)   Resp 16   SpO2 99%   Physical Exam Vitals and nursing note reviewed.  Constitutional:      Appearance: He is well-developed.  HENT:  Head: Normocephalic and atraumatic.  Eyes:     Conjunctiva/sclera: Conjunctivae normal.  Cardiovascular:     Rate and Rhythm: Normal rate and regular rhythm.     Heart sounds: No murmur.  Pulmonary:     Effort: Pulmonary effort is normal. No respiratory distress.     Breath sounds: Normal breath sounds.     Comments: Mild inspiratory and mild expiratory wheeze bilaterally, no increased work of breathing, no accessory muscle use Abdominal:     Palpations: Abdomen is soft.     Tenderness: There is no abdominal tenderness.  Musculoskeletal:     Cervical back: Neck supple.  Skin:    General: Skin is warm and dry.  Neurological:     Mental Status: He is alert.     ED Results /  Procedures / Treatments   Labs (all labs ordered are listed, but only abnormal results are displayed) Labs Reviewed - No data to display  EKG None  Radiology No results found.  Procedures Procedures (including critical care time)  Medications Ordered in ED Medications  albuterol (VENTOLIN HFA) 108 (90 Base) MCG/ACT inhaler 2 puff (2 puffs Inhalation Given 02/02/20 1107)  AeroChamber Plus Flo-Vu Medium MISC 1 each (1 each Other Given 02/02/20 1107)    ED Course  I have reviewed the triage vital signs and the nursing notes.  Pertinent labs & imaging results that were available during my care of the patient were reviewed by me and considered in my medical decision making (see chart for details).    MDM Rules/Calculators/A&P                      24 year old male presenting to ER with concern for wheezing, out of his albuterol inhaler.  On exam noted mild wheeze, he is well-appearing with stable vital signs.  Suspect this is related to his underlying asthma.  Provided Rx for albuterol, gave albuterol treatment in ER.  Given his relatively mild symptoms, I do not feel he needs course of steroids at this time.  Reviewed return precautions, recommend recheck with PCP.    After the discussed management above, the patient was determined to be safe for discharge.  The patient was in agreement with this plan and all questions regarding their care were answered.  ED return precautions were discussed and the patient will return to the ED with any significant worsening of condition.    Final Clinical Impression(s) / ED Diagnoses Final diagnoses:  Medication refill  Asthma, unspecified asthma severity, unspecified whether complicated, unspecified whether persistent    Rx / DC Orders ED Discharge Orders         Ordered    albuterol (VENTOLIN HFA) 108 (90 Base) MCG/ACT inhaler  Every 4 hours PRN     02/02/20 1103           Milagros Loll, MD 02/02/20 1154

## 2020-02-02 NOTE — ED Triage Notes (Signed)
Pt has asthma and out of inhaler. Reports feels like with seasonal allergies right now he needs one. Pt denies SOB.

## 2020-02-02 NOTE — Discharge Instructions (Signed)
Take inhaler as needed for your asthma, wheezing.  If you develop worsening difficulty in breathing, fever, chest pain or other new concerning symptom, recommend return to ER for recheck.  Otherwise recommend follow-up with your primary care doctor regarding your asthma management.

## 2020-02-29 ENCOUNTER — Encounter (HOSPITAL_COMMUNITY): Payer: Self-pay | Admitting: Emergency Medicine

## 2020-02-29 ENCOUNTER — Other Ambulatory Visit: Payer: Self-pay

## 2020-02-29 ENCOUNTER — Emergency Department (HOSPITAL_COMMUNITY)
Admission: EM | Admit: 2020-02-29 | Discharge: 2020-02-29 | Disposition: A | Discharge status: Home / Self Care | Payer: Self-pay | Attending: Emergency Medicine | Admitting: Emergency Medicine

## 2020-02-29 DIAGNOSIS — R0789 Other chest pain: Secondary | ICD-10-CM | POA: Insufficient documentation

## 2020-02-29 DIAGNOSIS — Z7951 Long term (current) use of inhaled steroids: Secondary | ICD-10-CM | POA: Insufficient documentation

## 2020-02-29 DIAGNOSIS — J45909 Unspecified asthma, uncomplicated: Secondary | ICD-10-CM | POA: Insufficient documentation

## 2020-02-29 DIAGNOSIS — J4520 Mild intermittent asthma, uncomplicated: Secondary | ICD-10-CM

## 2020-02-29 DIAGNOSIS — Z760 Encounter for issue of repeat prescription: Secondary | ICD-10-CM | POA: Insufficient documentation

## 2020-02-29 DIAGNOSIS — Z87891 Personal history of nicotine dependence: Secondary | ICD-10-CM | POA: Insufficient documentation

## 2020-02-29 MED ORDER — ALBUTEROL SULFATE HFA 108 (90 BASE) MCG/ACT IN AERS
2.0000 | INHALATION_SPRAY | Freq: Once | RESPIRATORY_TRACT | Status: AC
  Administered 2020-02-29: 2 via RESPIRATORY_TRACT
  Filled 2020-02-29: qty 6.7

## 2020-02-29 MED ORDER — ALBUTEROL SULFATE HFA 108 (90 BASE) MCG/ACT IN AERS: 2.0000 | INHALATION_SPRAY | RESPIRATORY_TRACT | 1 refills | Status: DC | PRN

## 2020-02-29 NOTE — Discharge Instructions (Signed)
Please read instructions below. Use your albuterol inhaler every 4-6 hours as needed for shortness of breath or wheezing.  It is important that you establish primary care for regular management of your asthma. Return to the ER if you have shortness of breath not improved by your inhaler, or new or concerning symptoms.

## 2020-02-29 NOTE — ED Provider Notes (Signed)
[facility]   CSN: [id number] Arrival date & time: 02/29/20  1023     History Chief Complaint  Patient presents with  . Asthma  . Medication Refill    Joshua Roach is a 24 y.o. male w PMHx asthma, presenting to the ED with complaint of asthma and requests on medication refill. Pt reports he felt a little bit of tightness overnight and this morning, however does not feel like he is having an asthma attack.  He states he is needed to come to the ED for his albuterol refill as he currently does not have a PCP, however with his employment he is now eligible for benefits and will be applying shortly.  He states albuterol helps his symptoms, and had Atrovent a couple months back which also provided relief.  No other complaints.  The history is provided by the patient.       Past Medical History:  Diagnosis Date  . Asthma     There are no problems to display for this patient.   History reviewed. No pertinent surgical history.     Family History  Problem Relation Age of Onset  . Healthy Mother     Social History   Tobacco Use  . Smoking status: Former Smoker    Types: Cigarettes, Cigars  . Smokeless tobacco: Never Used  Vaping Use  . Vaping Use: Former  Substance Use Topics  . Alcohol use: Not Currently  . Drug use: Not Currently    Types: Marijuana    Comment: 2 times a week    Home Medications Prior to Admission medications   Medication Sig Start Date End Date Taking? Authorizing Provider  acetaminophen (TYLENOL) 325 MG tablet Take 650 mg by mouth every 6 (six) hours as needed for mild pain.    [provider]  albuterol (VENTOLIN HFA) 108 (90 Base) MCG/ACT inhaler Inhale 2 puffs into the lungs every 4 (four) hours as needed for wheezing or shortness of breath. 02/29/20   Vinh Sachs, [place] N, PA-C  ondansetron (ZOFRAN ODT) 4 MG disintegrating tablet Take 1 tablet (4 mg total) by mouth every 8 (eight) hours as  needed for nausea or vomiting. 08/13/19   Milagros Loll, MD  pantoprazole (PROTONIX) 40 MG tablet Take 1 tablet (40 mg total) by mouth daily. 08/13/19 09/12/19  Milagros Loll, MD  predniSONE (STERAPRED UNI-PAK 21 TAB) 10 MG (21) TBPK tablet Take by mouth daily. Take 6 tabs by mouth daily  for 2 days, then 5 tabs for 2 days, then 4 tabs for 2 days, then 3 tabs for 2 days, 2 tabs for 2 days, then 1 tab by mouth daily for 2 days 11/22/19   Dietrich Pates, PA-C    Allergies    Patient has no known allergies.  Review of Systems   Review of Systems  Constitutional: Negative for fever.  Respiratory: Positive for chest tightness and wheezing.     Physical Exam Updated Vital Signs BP (!) 146/83 (BP Location: Left Arm)   Pulse 89   Temp 98.9 F (37.2 C) (Oral)   Resp 19   SpO2 97%   Physical Exam Vitals and nursing note reviewed.  Constitutional:      General: He is not in acute distress.    Appearance: He is well-developed.  HENT:     Head: Normocephalic and atraumatic.  Eyes:     Conjunctiva/sclera: Conjunctivae normal.  Cardiovascular:     Rate and Rhythm: Normal  rate and regular rhythm.  Pulmonary:     Effort: Pulmonary effort is normal. No respiratory distress.     Breath sounds: Wheezing (Faint expiratory wheezes bilaterally) present.     Comments: Speaking in full sentences.  O2 saturation 97% on room air. Neurological:     Mental Status: He is alert.  Psychiatric:        Mood and Affect: Mood normal.        Behavior: Behavior normal.     ED Results / Procedures / Treatments   Labs (all labs ordered are listed, but only abnormal results are displayed) Labs Reviewed - No data to display  EKG None  Radiology No results found.  Procedures Procedures (including critical care time)  Medications Ordered in ED Medications  albuterol (VENTOLIN HFA) 108 (90 Base) MCG/ACT inhaler 2 puff (has no administration in time range)    ED Course  I have reviewed the  triage vital signs and the nursing notes.  Pertinent labs & imaging results that were available during my care of the patient were reviewed by me and considered in my medical decision making (see chart for details).    MDM Rules/Calculators/A&P                          Patient presenting with asthma, requesting albuterol refill.  He is faint expiratory wheezes bilaterally, however is in no respiratory distress with normal effort.  O2 saturation is excellent at 97% on room air.  Stressed importance of establishing PCP for regular management of his asthma.  Albuterol given.  Do not believe steroids are indicated at this time.  Patient discharged.  Discussed results, findings, treatment and follow up. Patient advised of return precautions. Patient verbalized understanding and agreed with plan.  Final Clinical Impression(s) / ED Diagnoses Final diagnoses:  Medication refill  Intermittent asthma without complication, unspecified asthma severity    Rx / DC Orders ED Discharge Orders         Ordered    albuterol (VENTOLIN HFA) 108 (90 Base) MCG/ACT inhaler  Every 4 hours PRN     Discontinue  Reprint     02/29/20 1124           Opaline Reyburn, [place] N, [place] 02/29/20 1125    Lorre Nick, MD 03/01/20 1524

## 2020-02-29 NOTE — ED Triage Notes (Signed)
Patient here from home reporting asthma symptoms. States that he is out of his inhaler and needs refill.

## 2020-04-09 ENCOUNTER — Emergency Department (HOSPITAL_COMMUNITY)
Admission: EM | Admit: 2020-04-09 | Discharge: 2020-04-09 | Disposition: A | Discharge status: Home / Self Care | Payer: Self-pay | Attending: Emergency Medicine | Admitting: Emergency Medicine

## 2020-04-09 ENCOUNTER — Encounter (HOSPITAL_COMMUNITY): Payer: Self-pay | Admitting: Emergency Medicine

## 2020-04-09 ENCOUNTER — Other Ambulatory Visit: Payer: Self-pay

## 2020-04-09 ENCOUNTER — Emergency Department (HOSPITAL_COMMUNITY): Payer: Self-pay

## 2020-04-09 DIAGNOSIS — J4531 Mild persistent asthma with (acute) exacerbation: Secondary | ICD-10-CM | POA: Insufficient documentation

## 2020-04-09 DIAGNOSIS — R0602 Shortness of breath: Secondary | ICD-10-CM | POA: Insufficient documentation

## 2020-04-09 DIAGNOSIS — Z87891 Personal history of nicotine dependence: Secondary | ICD-10-CM | POA: Insufficient documentation

## 2020-04-09 LAB — BASIC METABOLIC PANEL
Anion gap: 11 (ref 5–15)
BUN: 13 mg/dL (ref 6–20)
CO2: 27 mmol/L (ref 22–32)
Calcium: 9.4 mg/dL (ref 8.9–10.3)
Chloride: 104 mmol/L (ref 98–111)
Creatinine, Ser: 1.04 mg/dL (ref 0.61–1.24)
GFR calc Af Amer: >60 mL/min (ref >60)
GFR calc non Af Amer: >60 mL/min (ref >60)
Glucose, Bld: 101 mg/dL — ABNORMAL HIGH (ref 70–99)
Potassium: 3.5 mmol/L (ref 3.5–5.1)
Sodium: 142 mmol/L (ref 135–145)

## 2020-04-09 LAB — TROPONIN I (HIGH SENSITIVITY): Troponin I (High Sensitivity): 2 ng/L (ref <18)

## 2020-04-09 LAB — CBC
HCT: 45.7 % (ref 39.0–52.0)
Hemoglobin: 15.4 g/dL (ref 13.0–17.0)
MCH: 30.2 pg (ref 26.0–34.0)
MCHC: 33.7 g/dL (ref 30.0–36.0)
MCV: 89.6 fL (ref 80.0–100.0)
Platelets: 341 10*3/uL (ref 150–400)
RBC: 5.1 MIL/uL (ref 4.22–5.81)
RDW: 12 % (ref 11.5–15.5)
WBC: 9.6 10*3/uL (ref 4.0–10.5)
nRBC: 0 % (ref 0.0–0.2)

## 2020-04-09 MED ORDER — PREDNISONE 10 MG (21) PO TBPK
ORAL_TABLET | Freq: Every day | ORAL | 0 refills | Status: DC
Start: 2020-04-09 — End: 2021-06-09

## 2020-04-09 MED ORDER — ALBUTEROL SULFATE HFA 108 (90 BASE) MCG/ACT IN AERS
2.0000 | INHALATION_SPRAY | Freq: Once | RESPIRATORY_TRACT | Status: AC
  Administered 2020-04-09: 2 via RESPIRATORY_TRACT
  Filled 2020-04-09: qty 6.7

## 2020-04-09 NOTE — ED Provider Notes (Signed)
[facility]PT Provider Note   CSN: [id number] Arrival date & time: 04/09/20  0533     History Chief Complaint  Patient presents with  . Chest Pain  . Asthma    Joshua Roach is a 24 y.o. male.  The history is provided by the patient. No language interpreter was used.  Chest Pain Asthma Associated symptoms include chest pain.     24 year old male with history of asthma presenting for evaluation of chest pain and shortness of breath.  Patient states he normally uses inhaler about 4-5 times a day but ran out of his inhaler approximately 2 days ago.  Last night he developed shortness of breath and chest tightness which was similar to an asthma attack.  Symptom persist prompting this ER visit.  Does not complain of any associated fever chills no productive cough no loss of taste or smell no nausea vomiting diarrhea no exertional chest pain.  He is unsure what triggers his asthma, aside from not having his inhaler available.  His symptom is mild to moderate in severity.  He has not had his Covid vaccination.  No significant cardiac history.  Past Medical History:  Diagnosis Date  . Asthma     There are no problems to display for this patient.   History reviewed. No pertinent surgical history.     Family History  Problem Relation Age of Onset  . Healthy Mother     Social History   Tobacco Use  . Smoking status: Former Smoker    Types: Cigarettes, Cigars  . Smokeless tobacco: Never Used  Vaping Use  . Vaping Use: Former  Substance Use Topics  . Alcohol use: Not Currently  . Drug use: Not Currently    Types: Marijuana    Comment: 2 times a week    Home Medications Prior to Admission medications   Medication Sig Start Date End Date Taking? Authorizing Provider  acetaminophen (TYLENOL) 325 MG tablet Take 650 mg by mouth every 6 (six) hours as needed for mild pain.    [provider]  albuterol (VENTOLIN HFA) 108 (90 Base)  MCG/ACT inhaler Inhale 2 puffs into the lungs every 4 (four) hours as needed for wheezing or shortness of breath. 02/29/20   Robinson, [place] N, PA-C  ondansetron (ZOFRAN ODT) 4 MG disintegrating tablet Take 1 tablet (4 mg total) by mouth every 8 (eight) hours as needed for nausea or vomiting. 08/13/19   Milagros Loll, MD  pantoprazole (PROTONIX) 40 MG tablet Take 1 tablet (40 mg total) by mouth daily. 08/13/19 09/12/19  Milagros Loll, MD  predniSONE (STERAPRED UNI-PAK 21 TAB) 10 MG (21) TBPK tablet Take by mouth daily. Take 6 tabs by mouth daily  for 2 days, then 5 tabs for 2 days, then 4 tabs for 2 days, then 3 tabs for 2 days, 2 tabs for 2 days, then 1 tab by mouth daily for 2 days 11/22/19   Dietrich Pates, PA-C    Allergies    Patient has no known allergies.  Review of Systems   Review of Systems  Cardiovascular: Positive for chest pain.  All other systems reviewed and are negative.   Physical Exam Updated Vital Signs BP 131/67   Pulse (!) 57   Temp 98.3 F (36.8 C) (Oral)   Resp 18   Ht 5\' 5"  (1.651 m)   Wt 86.2 kg   SpO2 97%   BMI 31.62 kg/m   Physical Exam Vitals and nursing note  reviewed.  Constitutional:      General: He is not in acute distress.    Appearance: He is well-developed.  HENT:     Head: Atraumatic.  Eyes:     Conjunctiva/sclera: Conjunctivae normal.  Cardiovascular:     Rate and Rhythm: Normal rate and regular rhythm.     Heart sounds: Normal heart sounds.  Pulmonary:     Effort: Pulmonary effort is normal.     Breath sounds: Wheezing (Faint expiratory wheezes.) present.  Abdominal:     Palpations: Abdomen is soft.     Tenderness: There is no abdominal tenderness.  Musculoskeletal:        General: No swelling.     Cervical back: Neck supple.  Skin:    Findings: No rash.  Neurological:     Mental Status: He is alert and oriented to person, place, and time.  Psychiatric:        Mood and Affect: Mood normal.     ED Results / Procedures  / Treatments   Labs (all labs ordered are listed, but only abnormal results are displayed) Labs Reviewed  BASIC METABOLIC PANEL - Abnormal; Notable for the following components:      Result Value   Glucose, Bld 101 (*)    All other components within normal limits  CBC  TROPONIN I (HIGH SENSITIVITY)  TROPONIN I (HIGH SENSITIVITY)    EKG None  ED ECG REPORT   Date: 04/09/2020  Rate: 64  Rhythm: normal sinus rhythm  QRS Axis: normal  Intervals: PR shortened  ST/T Wave abnormalities: normal  Conduction Disutrbances:none  Narrative Interpretation: borderline RAD, borderline T wave abnormalities  Old EKG Reviewed: unchanged  I have personally reviewed the EKG tracing and agree with the computerized printout as noted.   Radiology DG Chest 2 View  Result Date: 04/09/2020 CLINICAL DATA:  Chest pain since last evening. EXAM: CHEST - 2 VIEW COMPARISON:  11/22/2019 FINDINGS: The cardiac silhouette, mediastinal and hilar contours are normal. The lungs are clear. No pleural effusion. The bony thorax is intact. IMPRESSION: No acute cardiopulmonary findings. Electronically Signed   By: Rudie Meyer M.D.   On: 04/09/2020 06:26    Procedures Procedures (including critical care time)  Medications Ordered in ED Medications  albuterol (VENTOLIN HFA) 108 (90 Base) MCG/ACT inhaler 2 puff (has no administration in time range)    ED Course  I have reviewed the triage vital signs and the nursing notes.  Pertinent labs & imaging results that were available during my care of the patient were reviewed by me and considered in my medical decision making (see chart for details).    MDM Rules/Calculators/A&P                          BP 131/67   Pulse (!) 57   Temp 98.3 F (36.8 C) (Oral)   Resp 18   Ht 5\' 5"  (1.651 m)   Wt 86.2 kg   SpO2 97%   BMI 31.62 kg/m   Final Clinical Impression(s) / ED Diagnoses Final diagnoses:  Shortness of breath  Mild persistent asthma with exacerbation     Rx / DC Orders ED Discharge Orders         Ordered    predniSONE (STERAPRED UNI-PAK 21 TAB) 10 MG (21) TBPK tablet  Daily     Discontinue  Reprint     04/09/20 0829         7:06 AM Patient with  history of asthma who ran out of his inhaler who is here for shortness of breath and wheezing secondary to not having access to his inhaler.  He is currently in the process of finding a pulmonologist for further management of his underlying disease.  He has mild wheezing on exam.  He is otherwise well-appearing, speaking in complete sentences, in no acute discomfort.  No infectious symptoms, doubt COVID-19.  Chest pain is not consistent with ACS.  Will provide albuterol inhaler, and will also prescribe prednisone taper course.  Low suspicion for PE. HEART score 0, doubt ACS.  RAHSAAN WEAKLAND was evaluated in Emergency Department on 04/09/2020 for the symptoms described in the history of present illness. He was evaluated in the context of the global COVID-19 pandemic, which necessitated consideration that the patient might be at risk for infection with the SARS-CoV-2 virus that causes COVID-19. Institutional protocols and algorithms that pertain to the evaluation of patients at risk for COVID-19 are in a state of rapid change based on information released by regulatory bodies including the CDC and federal and state organizations. These policies and algorithms were followed during the patient's care in the ED.    Fayrene Helper, PA-C 04/09/20 0830    Long, Arlyss Repress, MD 04/10/20 [phone]

## 2020-04-09 NOTE — ED Triage Notes (Signed)
Patient complaining of mid chest pain and having trouble breathing. Patient states this started around mid night. Patient has hx of asthma.

## 2020-08-26 ENCOUNTER — Other Ambulatory Visit: Payer: Self-pay

## 2020-08-26 ENCOUNTER — Encounter (HOSPITAL_COMMUNITY): Payer: Self-pay

## 2020-08-26 ENCOUNTER — Emergency Department (HOSPITAL_COMMUNITY)
Admission: EM | Admit: 2020-08-26 | Discharge: 2020-08-26 | Disposition: A | Discharge status: Home / Self Care | Payer: Self-pay | Attending: Emergency Medicine | Admitting: Emergency Medicine

## 2020-08-26 DIAGNOSIS — J45909 Unspecified asthma, uncomplicated: Secondary | ICD-10-CM | POA: Insufficient documentation

## 2020-08-26 DIAGNOSIS — R059 Cough, unspecified: Secondary | ICD-10-CM | POA: Insufficient documentation

## 2020-08-26 DIAGNOSIS — Z87891 Personal history of nicotine dependence: Secondary | ICD-10-CM | POA: Insufficient documentation

## 2020-08-26 MED ORDER — ALBUTEROL SULFATE HFA 108 (90 BASE) MCG/ACT IN AERS
2.0000 | INHALATION_SPRAY | Freq: Once | RESPIRATORY_TRACT | Status: AC
  Administered 2020-08-26: 20:00:00 2 via RESPIRATORY_TRACT
  Filled 2020-08-26: qty 6.7

## 2020-08-26 MED ORDER — AEROCHAMBER Z-STAT PLUS/MEDIUM MISC
1.0000 | Freq: Once | Status: AC
  Administered 2020-08-26: 20:00:00 1
  Filled 2020-08-26: qty 1

## 2020-08-26 NOTE — ED Provider Notes (Signed)
[facility]-EMERGENCY DEPT Provider Note   CSN: [id number] Arrival date & time: 08/26/20  1113     History Chief Complaint  Patient presents with  . Asthma    Joshua Roach is a 24 y.o. male with a hx of asthma who presents to the ED with complaints of cough that began last night.  Patient states that he believes the weather change triggered his asthma, last night he started to cough with some shortness of breath and wheezing, use his inhaler but had run out.  This morning symptoms returned, however he did not have his inhaler therefore he came to the emergency department.  While waiting in the ED his symptoms seem to have improved, he still remains with very mild symptoms.  No other alleviating or aggravating factors.  Denies fever, chills, congestion, ear pain, sore throat, chest pain, or leg pain/swelling.  HPI     Past Medical History:  Diagnosis Date  . Asthma     There are no problems to display for this patient.   History reviewed. No pertinent surgical history.     Family History  Problem Relation Age of Onset  . Healthy Mother     Social History   Tobacco Use  . Smoking status: Former Smoker    Types: Cigarettes, Cigars  . Smokeless tobacco: Never Used  Vaping Use  . Vaping Use: Former  Substance Use Topics  . Alcohol use: Not Currently  . Drug use: Not Currently    Types: Marijuana    Comment: 2 times a week    Home Medications Prior to Admission medications   Medication Sig Start Date End Date Taking? Authorizing Provider  acetaminophen (TYLENOL) 325 MG tablet Take 650 mg by mouth every 6 (six) hours as needed for mild pain.    [provider]  albuterol (VENTOLIN HFA) 108 (90 Base) MCG/ACT inhaler Inhale 2 puffs into the lungs every 4 (four) hours as needed for wheezing or shortness of breath. 02/29/20   Robinson, [place] N, PA-C  ondansetron (ZOFRAN ODT) 4 MG disintegrating tablet Take 1 tablet (4 mg total) by mouth every  8 (eight) hours as needed for nausea or vomiting. 08/13/19   Milagros Loll, MD  pantoprazole (PROTONIX) 40 MG tablet Take 1 tablet (40 mg total) by mouth daily. 08/13/19 09/12/19  Milagros Loll, MD  predniSONE (STERAPRED UNI-PAK 21 TAB) 10 MG (21) TBPK tablet Take by mouth daily. Take 6 tabs by mouth daily  for 2 days, then 5 tabs for 2 days, then 4 tabs for 2 days, then 3 tabs for 2 days, 2 tabs for 2 days, then 1 tab by mouth daily for 2 days 04/09/20   Fayrene Helper, PA-C    Allergies    Patient has no known allergies.  Review of Systems   Review of Systems  Constitutional: Negative for chills and fever.  HENT: Negative for congestion, ear pain and sore throat.   Respiratory: Positive for cough, shortness of breath and wheezing.   Cardiovascular: Negative for chest pain and leg swelling.  Gastrointestinal: Negative for abdominal pain, diarrhea, nausea and vomiting.  Neurological: Negative for syncope.    Physical Exam Updated Vital Signs BP 128/79 (BP Location: Left Arm)   Pulse 88   Temp 98.7 F (37.1 C) (Oral)   Resp 18   SpO2 97%   Physical Exam Vitals and nursing note reviewed.  Constitutional:      General: He is not in acute distress.  Appearance: He is well-developed. He is not toxic-appearing.  HENT:     Head: Normocephalic and atraumatic.     Right Ear: Ear canal normal. Tympanic membrane is not perforated, erythematous, retracted or bulging.     Left Ear: Ear canal normal. Tympanic membrane is not perforated, erythematous, retracted or bulging.     Ears:     Comments: No mastoid erythema/swellng/tenderness.     Nose:     Right Sinus: No maxillary sinus tenderness or frontal sinus tenderness.     Left Sinus: No maxillary sinus tenderness or frontal sinus tenderness.     Mouth/Throat:     Pharynx: Oropharynx is clear. Uvula midline. No oropharyngeal exudate or posterior oropharyngeal erythema.     Comments: Posterior oropharynx is symmetric appearing.  Patient tolerating own secretions without difficulty. No trismus. No drooling. No hot potato voice. No swelling beneath the tongue, submandibular compartment is soft.  Eyes:     General:        Right eye: No discharge.        Left eye: No discharge.     Conjunctiva/sclera: Conjunctivae normal.  Cardiovascular:     Rate and Rhythm: Normal rate and regular rhythm.  Pulmonary:     Effort: Pulmonary effort is normal. No respiratory distress.     Breath sounds: Normal breath sounds. No wheezing, rhonchi or rales.  Abdominal:     General: There is no distension.     Palpations: Abdomen is soft.     Tenderness: There is no abdominal tenderness.  Musculoskeletal:     Cervical back: Neck supple. No rigidity.  Lymphadenopathy:     Cervical: No cervical adenopathy.  Skin:    General: Skin is warm and dry.     Findings: No rash.  Neurological:     Mental Status: He is alert.  Psychiatric:        Behavior: Behavior normal.     ED Results / Procedures / Treatments   Labs (all labs ordered are listed, but only abnormal results are displayed) Labs Reviewed - No data to display  EKG None  Radiology No results found.  Procedures Procedures (including critical care time)  Medications Ordered in ED Medications  albuterol (VENTOLIN HFA) 108 (90 Base) MCG/ACT inhaler 2 puff (has no administration in time range)  aerochamber Z-Stat Plus/medium 1 each (has no administration in time range)    ED Course  I have reviewed the triage vital signs and the nursing notes.  Pertinent labs & imaging results that were available during my care of the patient were reviewed by me and considered in my medical decision making (see chart for details).    MDM Rules/Calculators/A&P                          Patient presents to the emergency department with complaints of cough.  He is nontoxic, resting comfortably, vitals within normal limits with exception of mildly elevated blood pressure, doubt  hypertensive emergency.  On my assessment his lungs are clear, there are no focal adventitious breath sounds, he is afebrile, I have a low suspicion for community-acquired pneumonia, we discussed the option of a chest x-ray, patient declined which I am in agreement with.  He has no signs of AOM, AOE, mastoiditis, strep, or acute sinusitis on exam.  He was given 2 puffs of albuterol inhaler with spacer with resolution of his symptoms, remains with clear lungs, does not seem consistent with asthma exacerbation requiring  steroids which he is in agreement with.  Will discharge home with inhaler. I discussed treatment plan, need for follow-up, and return precautions with the patient. Provided opportunity for questions, patient confirmed understanding and is in agreement with plan.   Final Clinical Impression(s) / ED Diagnoses Final diagnoses:  Cough    Rx / DC Orders ED Discharge Orders    None       Cherly Anderson, PA-C 08/26/20 2021    Milagros Loll, MD 08/27/20 2348

## 2020-08-26 NOTE — Discharge Instructions (Addendum)
Please use your inhaler 1 to 2 puffs every 4-6 hours as needed for cough, wheezing, chest tightness, or shortness of breath.  Follow with primary care provider within 1 week.  Return to the ER for new or worsening symptoms including but not limited to return of trouble breathing, fever, chest pain, passing out, coughing up blood, or any other concerns.

## 2020-08-26 NOTE — ED Triage Notes (Signed)
Pt reports cough and SHOB due to asthma exacerbation this morning. Pt reports using albuterol inhaler last night but ran out.

## 2020-08-26 NOTE — ED Notes (Signed)
An After Visit Summary was printed and given to the patient. Discharge instructions given and no further questions at this time.  

## 2020-09-06 IMAGING — DX DG CHEST 1V PORT
1 series · 1 of 1 positions shown · non-contrast
Comparison: Chest radiograph 07/02/2018

CLINICAL DATA: Asthma.

EXAM:
PORTABLE CHEST 1 VIEW

[chest ap]
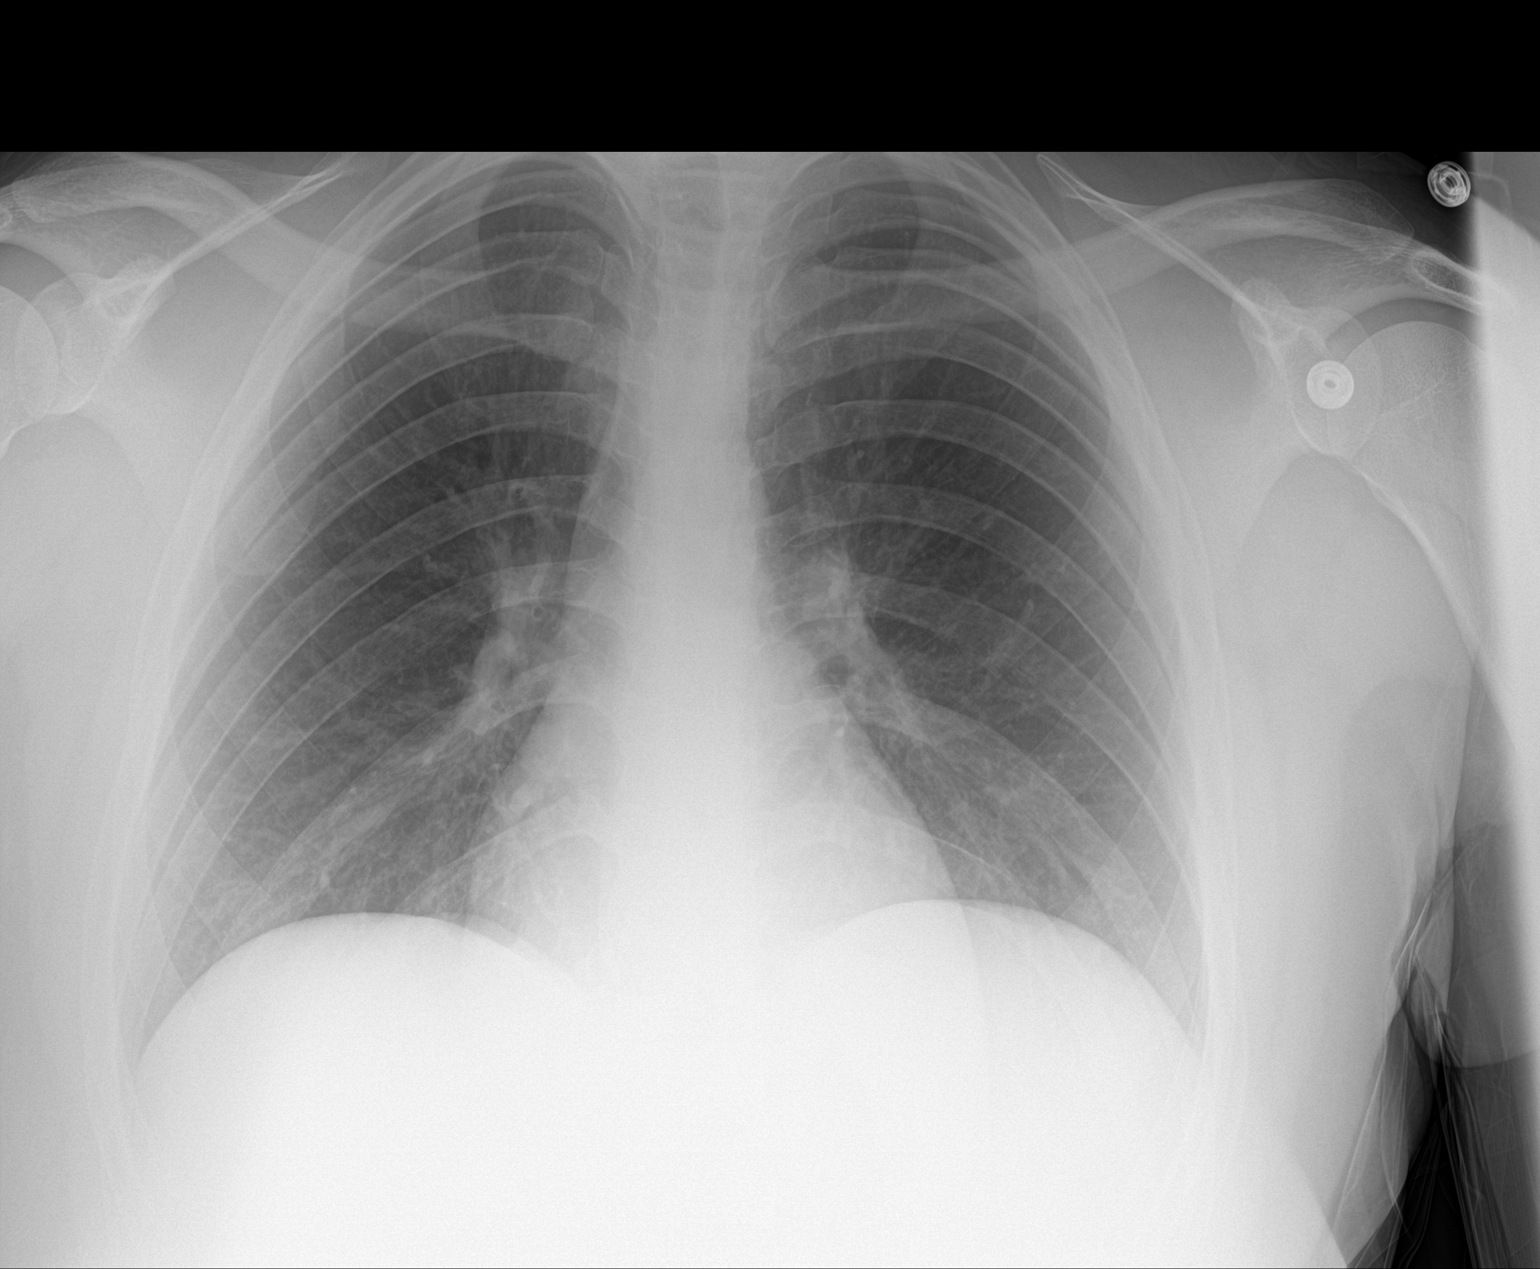

[1 of 1 positions shown; findings below may reference images not displayed]

FINDINGS: Heart size within normal limits. No evidence of airspace
consolidation within the lungs. No evidence of pleural effusion or
pneumothorax. No acute bony abnormality.
IMPRESSION: No evidence of acute cardiopulmonary abnormality.

## 2021-01-03 ENCOUNTER — Emergency Department (HOSPITAL_COMMUNITY)
Admission: EM | Admit: 2021-01-03 | Discharge: 2021-01-03 | Disposition: A | Discharge status: Home / Self Care | Payer: Self-pay | Attending: Emergency Medicine | Admitting: Emergency Medicine

## 2021-01-03 ENCOUNTER — Encounter (HOSPITAL_COMMUNITY): Payer: Self-pay | Admitting: Emergency Medicine

## 2021-01-03 ENCOUNTER — Other Ambulatory Visit: Payer: Self-pay

## 2021-01-03 DIAGNOSIS — Z87891 Personal history of nicotine dependence: Secondary | ICD-10-CM | POA: Insufficient documentation

## 2021-01-03 DIAGNOSIS — S20212A Contusion of left front wall of thorax, initial encounter: Secondary | ICD-10-CM | POA: Insufficient documentation

## 2021-01-03 DIAGNOSIS — J4520 Mild intermittent asthma, uncomplicated: Secondary | ICD-10-CM | POA: Insufficient documentation

## 2021-01-03 DIAGNOSIS — X501XXA Overexertion from prolonged static or awkward postures, initial encounter: Secondary | ICD-10-CM | POA: Insufficient documentation

## 2021-01-03 DIAGNOSIS — Y9367 Activity, basketball: Secondary | ICD-10-CM | POA: Insufficient documentation

## 2021-01-03 MED ORDER — ALBUTEROL SULFATE HFA 108 (90 BASE) MCG/ACT IN AERS
2.0000 | INHALATION_SPRAY | Freq: Once | RESPIRATORY_TRACT | Status: AC
  Administered 2021-01-03: 2 via RESPIRATORY_TRACT
  Filled 2021-01-03: qty 6.7

## 2021-01-03 NOTE — ED Triage Notes (Signed)
Pt reports asthma flared up x 2 days.  He is out of his inhaler.  Also reports bruising to L chest since being elbowed while playing basketball last Sunday.  Pain with movement.

## 2021-01-03 NOTE — Discharge Instructions (Signed)
Use the inhaler 2 puffs 4 times a day.  Try to avoid smoking.  For the bruising to her chest, use heat on it 2 or 3 times a day to help with discomfort.

## 2021-01-03 NOTE — ED Provider Notes (Signed)
[facility] EMERGENCY DEPARTMENT Provider Note   CSN: [id number] Arrival date & time: 01/03/21  1040     History Chief Complaint  Patient presents with  . Asthma  . Bruising to chest    Joshua Roach is a 25 y.o. male.  HPI He presents for evaluation of wheezing, and being out of his albuterol inhaler.  Onset of symptoms several days ago.  He denies fever, chills, cough or shortness of breath.  His chest is uncomfortable because he had a bruise from playing basketball several days ago.  There are no other known active modifying factors    Past Medical History:  Diagnosis Date  . Asthma     There are no problems to display for this patient.   History reviewed. No pertinent surgical history.     Family History  Problem Relation Age of Onset  . Healthy Mother     Social History   Tobacco Use  . Smoking status: Former Smoker    Types: Cigarettes, Cigars  . Smokeless tobacco: Never Used  Vaping Use  . Vaping Use: Former  Substance Use Topics  . Alcohol use: Not Currently  . Drug use: Not Currently    Types: Marijuana    Comment: 2 times a week    Home Medications Prior to Admission medications   Medication Sig Start Date End Date Taking? Authorizing Provider  acetaminophen (TYLENOL) 325 MG tablet Take 650 mg by mouth every 6 (six) hours as needed for mild pain.    [provider]  albuterol (VENTOLIN HFA) 108 (90 Base) MCG/ACT inhaler Inhale 2 puffs into the lungs every 4 (four) hours as needed for wheezing or shortness of breath. 02/29/20   Robinson, [place] N, PA-C  ondansetron (ZOFRAN ODT) 4 MG disintegrating tablet Take 1 tablet (4 mg total) by mouth every 8 (eight) hours as needed for nausea or vomiting. 08/13/19   Milagros Loll, MD  pantoprazole (PROTONIX) 40 MG tablet Take 1 tablet (40 mg total) by mouth daily. 08/13/19 09/12/19  Milagros Loll, MD  predniSONE (STERAPRED UNI-PAK 21 TAB) 10 MG (21) TBPK tablet Take by mouth  daily. Take 6 tabs by mouth daily  for 2 days, then 5 tabs for 2 days, then 4 tabs for 2 days, then 3 tabs for 2 days, 2 tabs for 2 days, then 1 tab by mouth daily for 2 days 04/09/20   Fayrene Helper, PA-C    Allergies    Patient has no known allergies.  Review of Systems   Review of Systems  All other systems reviewed and are negative.   Physical Exam Updated Vital Signs BP (!) 152/69 (BP Location: Left Arm)   Pulse 72   Temp 97.6 F (36.4 C)   Resp 18   SpO2 100%   Physical Exam Vitals and nursing note reviewed.  Constitutional:      General: He is not in acute distress.    Appearance: He is well-developed. He is not toxic-appearing or diaphoretic.  HENT:     Head: Normocephalic and atraumatic.     Right Ear: External ear normal.     Left Ear: External ear normal.  Eyes:     Conjunctiva/sclera: Conjunctivae normal.     Pupils: Pupils are equal, round, and reactive to light.  Neck:     Trachea: Phonation normal.  Cardiovascular:     Rate and Rhythm: Normal rate and regular rhythm.     Heart sounds: Normal heart sounds.  Pulmonary:     Effort: Pulmonary effort is normal. No respiratory distress.     Breath sounds: No stridor.     Comments: Somewhat diminished air movement bilaterally, with scattered end expiratory wheezes.  There is no increased work of breathing. Abdominal:     General: There is no distension.     Palpations: Abdomen is soft.     Tenderness: There is no abdominal tenderness.  Musculoskeletal:        General: Normal range of motion.     Cervical back: Normal range of motion and neck supple.  Skin:    General: Skin is warm and dry.     Comments: Subacute bruise left upper anterior chest wall.  No associated tenderness or crepitation.  Neurological:     Mental Status: He is alert and oriented to person, place, and time.     Cranial Nerves: No cranial nerve deficit.     Sensory: No sensory deficit.     Motor: No abnormal muscle tone.      Coordination: Coordination normal.  Psychiatric:        Behavior: Behavior normal.        Thought Content: Thought content normal.        Judgment: Judgment normal.     ED Results / Procedures / Treatments   Labs (all labs ordered are listed, but only abnormal results are displayed) Labs Reviewed - No data to display  EKG None  Radiology No results found.  Procedures Procedures   Medications Ordered in ED Medications - No data to display  ED Course  I have reviewed the triage vital signs and the nursing notes.  Pertinent labs & imaging results that were available during my care of the patient were reviewed by me and considered in my medical decision making (see chart for details).    MDM Rules/Calculators/A&P                           Patient Vitals for the past 24 hrs:  BP Temp Pulse Resp SpO2  01/03/21 1044 (!) 152/69 97.6 F (36.4 C) 72 18 100 %    12:26 PM Reevaluation with update and discussion. After initial assessment and treatment, an updated evaluation reveals he states his wheezing has resolved.  Findings discussed and questions answered. Mancel Bale   Medical Decision Making:  This patient is presenting for evaluation of wheezing and being out of albuterol a secondary complaint of bruise of chest, which does require a range of treatment options, and is a complaint that involves a moderate risk of morbidity and mortality. The differential diagnoses include asthma exacerbation, respiratory illness, contusion chest wall. I decided to review old records, and in summary Young adult male presenting with asthma and being out of inhaler.  I do not additional historical information from anyone.   Critical Interventions-clinical evaluation, medication treatment, observation reassessment  After These Interventions, the Patient was reevaluated and was found stable for discharge.  Patient with mild exacerbation of asthma symptoms, improved with treatment.   Associated bruise to left anterior chest wall, subacute and can be treated symptomatically.  CRITICAL CARE-no Performed by: Mancel Bale  Nursing Notes Reviewed/ Care Coordinated Applicable Imaging Reviewed Interpretation of Laboratory Data incorporated into ED treatment  The patient appears reasonably screened and/or stabilized for discharge and I doubt any other medical condition or other [facility] requiring further screening, evaluation, or treatment in the ED at this time prior to discharge.  Plan: Home Medications-OTC as needed; Home Treatments-Avoid smoking,heat for bruise to chest wall; return here if the recommended treatment, does not improve the symptoms; Recommended follow up-PCP, as needed     Final Clinical Impression(s) / ED Diagnoses Final diagnoses:  Mild intermittent asthma without complication  Contusion of left chest wall, initial encounter    Rx / DC Orders ED Discharge Orders    None       Mancel Bale, MD 01/03/21 1229

## 2021-06-09 ENCOUNTER — Emergency Department (HOSPITAL_COMMUNITY)
Admission: EM | Admit: 2021-06-09 | Discharge: 2021-06-09 | Disposition: A | Discharge status: Home / Self Care | Payer: Self-pay | Attending: Emergency Medicine | Admitting: Emergency Medicine

## 2021-06-09 ENCOUNTER — Encounter (HOSPITAL_COMMUNITY): Payer: Self-pay | Admitting: Emergency Medicine

## 2021-06-09 DIAGNOSIS — Z79899 Other long term (current) drug therapy: Secondary | ICD-10-CM | POA: Insufficient documentation

## 2021-06-09 DIAGNOSIS — R062 Wheezing: Secondary | ICD-10-CM

## 2021-06-09 DIAGNOSIS — Z87891 Personal history of nicotine dependence: Secondary | ICD-10-CM | POA: Insufficient documentation

## 2021-06-09 DIAGNOSIS — J45909 Unspecified asthma, uncomplicated: Secondary | ICD-10-CM | POA: Insufficient documentation

## 2021-06-09 MED ORDER — ALBUTEROL SULFATE HFA 108 (90 BASE) MCG/ACT IN AERS
2.0000 | INHALATION_SPRAY | RESPIRATORY_TRACT | 1 refills | Status: DC | PRN
Start: 2021-06-09 — End: 2022-01-10

## 2021-06-09 MED ORDER — ALBUTEROL SULFATE HFA 108 (90 BASE) MCG/ACT IN AERS
2.0000 | INHALATION_SPRAY | RESPIRATORY_TRACT | 1 refills | Status: DC | PRN
Start: 2021-06-09 — End: 2021-06-09

## 2021-06-09 MED ORDER — PREDNISONE 10 MG PO TABS
20.0000 mg | ORAL_TABLET | Freq: Every day | ORAL | 0 refills | Status: DC
Start: 2021-06-09 — End: 2022-01-25

## 2021-06-09 NOTE — Discharge Instructions (Addendum)
Take prednisone twice daily for the next 5 days. Take the albuterol inhaler every 4 hours as needed for wheezing. Return if things change or worsen.  If start feel short of breath or the wheezing does not improve. Follow-up with your primary care doctor to continue getting refills of your inhaler.

## 2021-06-09 NOTE — ED Provider Notes (Signed)
[facility] EMERGENCY DEPARTMENT Provider Note   CSN: [id number] Arrival date & time: 06/09/21  1353     History Chief Complaint  Patient presents with   Asthma    Joshua Roach is a 25 y.o. male.  HPI  Patient with history of asthma presents with wheezing.  Is been going on for 2 days, has been out of his inhaler for the last 3 or 4 days.  Does not take a daily nasal steroid, uses albuterol as needed.  Cites changing in seasons as a trigger.  Is not been able to see a primary care doctor due to change in employment and insurance recently.  He does occasionally feel short of breath, but not frequently.  Does not have any chest pain.  Denies any congestion or fevers at home.  Past Medical History:  Diagnosis Date   Asthma     There are no problems to display for this patient.   History reviewed. No pertinent surgical history.     Family History  Problem Relation Age of Onset   Healthy Mother     Social History   Tobacco Use   Smoking status: Former    Types: Cigarettes, Cigars   Smokeless tobacco: Never  Vaping Use   Vaping Use: Former  Substance Use Topics   Alcohol use: Not Currently   Drug use: Not Currently    Types: Marijuana    Comment: 2 times a week    Home Medications Prior to Admission medications   Medication Sig Start Date End Date Taking? Authorizing Provider  acetaminophen (TYLENOL) 325 MG tablet Take 650 mg by mouth every 6 (six) hours as needed for mild pain.    [provider]  albuterol (VENTOLIN HFA) 108 (90 Base) MCG/ACT inhaler Inhale 2 puffs into the lungs every 4 (four) hours as needed for wheezing or shortness of breath. 02/29/20   Robinson, [place] N, PA-C  ondansetron (ZOFRAN ODT) 4 MG disintegrating tablet Take 1 tablet (4 mg total) by mouth every 8 (eight) hours as needed for nausea or vomiting. 08/13/19   Milagros Loll, MD  pantoprazole (PROTONIX) 40 MG tablet Take 1 tablet (40 mg total) by mouth daily.  08/13/19 09/12/19  Milagros Loll, MD  predniSONE (STERAPRED UNI-PAK 21 TAB) 10 MG (21) TBPK tablet Take by mouth daily. Take 6 tabs by mouth daily  for 2 days, then 5 tabs for 2 days, then 4 tabs for 2 days, then 3 tabs for 2 days, 2 tabs for 2 days, then 1 tab by mouth daily for 2 days 04/09/20   Fayrene Helper, PA-C    Allergies    Patient has no known allergies.  Review of Systems   Review of Systems  Constitutional:  Negative for fever.  Respiratory:  Positive for cough and wheezing.   Cardiovascular:  Negative for chest pain.   Physical Exam Updated Vital Signs BP (!) 146/93 (BP Location: Right Arm)   Pulse 73   Temp 98 F (36.7 C)   Resp 20   SpO2 97%   Physical Exam Vitals and nursing note reviewed.  Constitutional:      Appearance: He is well-developed.  HENT:     Head: Normocephalic and atraumatic.  Eyes:     Conjunctiva/sclera: Conjunctivae normal.  Cardiovascular:     Rate and Rhythm: Normal rate and regular rhythm.     Heart sounds: No murmur heard. Pulmonary:     Effort: Pulmonary effort is normal. No respiratory  distress.     Breath sounds: Normal breath sounds.     Comments: Lungs are clear to auscultation bilaterally.  No wheezing on exam, no accessory muscle use.  No tachypnea.  Satting at 99% on room air. Abdominal:     Palpations: Abdomen is soft.     Tenderness: There is no abdominal tenderness.  Musculoskeletal:     Cervical back: Neck supple.  Skin:    General: Skin is warm and dry.  Neurological:     Mental Status: He is alert.    ED Results / Procedures / Treatments   Labs (all labs ordered are listed, but only abnormal results are displayed) Labs Reviewed - No data to display  EKG None  Radiology No results found.  Procedures Procedures   Medications Ordered in ED Medications - No data to display  ED Course  I have reviewed the triage vital signs and the nursing notes.  Pertinent labs & imaging results that were available  during my care of the patient were reviewed by me and considered in my medical decision making (see chart for details).    MDM Rules/Calculators/A&P                           Patient has history of asthma.  No wheezing auscultated on exam, he is not using any accessory muscle use or having any hypoxia.  No tachypnea or signs of respiratory distress.  We will refill his albuterol inhaler and give a short course of steroids to take as needed.  Considered pulmonary infection but doubt it given no fever or adventitious lung sounds on exam.  Doubt pneumonia, doubt emergent pathology.  Return precautions given, patient appropriate for discharge at this time.  Final Clinical Impression(s) / ED Diagnoses Final diagnoses:  None    Rx / DC Orders ED Discharge Orders     None        Theron Arista, PA-C 06/09/21 1409    Jacalyn Lefevre, MD 06/09/21 1417

## 2021-06-09 NOTE — ED Triage Notes (Signed)
Pt states he is out of his inhaler for 2 days and some SOB with walking. Denies CP or congestion.

## 2022-01-10 ENCOUNTER — Emergency Department (HOSPITAL_COMMUNITY)
Admission: EM | Admit: 2022-01-10 | Discharge: 2022-01-10 | Disposition: A | Discharge status: Home / Self Care | Payer: Self-pay | Attending: Emergency Medicine | Admitting: Emergency Medicine

## 2022-01-10 ENCOUNTER — Encounter (HOSPITAL_COMMUNITY): Payer: Self-pay | Admitting: Emergency Medicine

## 2022-01-10 ENCOUNTER — Other Ambulatory Visit: Payer: Self-pay

## 2022-01-10 DIAGNOSIS — J4521 Mild intermittent asthma with (acute) exacerbation: Secondary | ICD-10-CM | POA: Insufficient documentation

## 2022-01-10 DIAGNOSIS — Z7951 Long term (current) use of inhaled steroids: Secondary | ICD-10-CM | POA: Insufficient documentation

## 2022-01-10 MED ORDER — CETIRIZINE HCL 10 MG PO TABS: 10.0000 mg | ORAL_TABLET | Freq: Every day | ORAL | 0 refills | Status: AC

## 2022-01-10 MED ORDER — ALBUTEROL SULFATE HFA 108 (90 BASE) MCG/ACT IN AERS: 2.0000 | INHALATION_SPRAY | RESPIRATORY_TRACT | 6 refills | Status: DC | PRN

## 2022-01-10 MED ORDER — FLUTICASONE PROPIONATE 50 MCG/ACT NA SUSP: 1.0000 | Freq: Every day | NASAL | 2 refills | Status: AC

## 2022-01-10 NOTE — Discharge Instructions (Addendum)
Uses Zyrtec and Flonase daily.  Use inhaler as needed as prescribed.  Follow-up with your primary care provider for further asthma management.  Return to the ER for worsening or concerning symptoms. ?

## 2022-01-10 NOTE — ED Triage Notes (Signed)
Pt reports running out of inhaler 1 week ago. Pt report SHOB since and wheezing. Hx asthma.  ?

## 2022-01-10 NOTE — ED Provider Notes (Signed)
?[facility]-EMERGENCY DEPT ?Provider Note ? ? ?CSN: [id number] ?Arrival date & time: 01/10/22  1539 ? ?  ? ?History ? ?Chief Complaint  ?Patient presents with  ? Shortness of Breath  ? ? ?Joshua Roach is a 26 y.o. male. ? ?26 year old male with history of asthma presents with complaint of wheezing and shortness of breath x1 week since running out of his inhaler.  Denies fevers or recent sick symptoms.  Does smoke black and milds.  Not on any other medications currently. ? ? ?  ? ?Home Medications ?Prior to Admission medications   ?Medication Sig Start Date End Date Taking? Authorizing Provider  ?cetirizine (ZYRTEC ALLERGY) 10 MG tablet Take 1 tablet (10 mg total) by mouth daily. 01/10/22  Yes Jeannie Fend, PA-C  ?fluticasone (FLONASE) 50 MCG/ACT nasal spray Place 1 spray into both nostrils daily. 01/10/22  Yes Jeannie Fend, PA-C  ?acetaminophen (TYLENOL) 325 MG tablet Take 650 mg by mouth every 6 (six) hours as needed for mild pain.    [provider]  ?albuterol (VENTOLIN HFA) 108 (90 Base) MCG/ACT inhaler Inhale 2 puffs into the lungs every 4 (four) hours as needed for wheezing or shortness of breath. 01/10/22   Jeannie Fend, PA-C  ?ondansetron (ZOFRAN ODT) 4 MG disintegrating tablet Take 1 tablet (4 mg total) by mouth every 8 (eight) hours as needed for nausea or vomiting. 08/13/19   Milagros Loll, MD  ?pantoprazole (PROTONIX) 40 MG tablet Take 1 tablet (40 mg total) by mouth daily. 08/13/19 09/12/19  Milagros Loll, MD  ?predniSONE (DELTASONE) 10 MG tablet Take 2 tablets (20 mg total) by mouth daily. 06/09/21   Theron Arista, PA-C  ?   ? ?Allergies    ?Patient has no known allergies.   ? ?Review of Systems   ?Review of Systems ?Negative except as per HPI ?Physical Exam ?Updated Vital Signs ?BP (!) 153/94 (BP Location: Right Arm)   Pulse 84   Temp 98.2 ?F (36.8 ?C) (Oral)   Resp 20   SpO2 100%  ?Physical Exam ?Vitals and nursing note reviewed.  ?Constitutional:   ?    General: He is not in acute distress. ?   Appearance: He is well-developed. He is not diaphoretic.  ?HENT:  ?   Head: Normocephalic and atraumatic.  ?Eyes:  ?   Conjunctiva/sclera: Conjunctivae normal.  ?Cardiovascular:  ?   Rate and Rhythm: Normal rate and regular rhythm.  ?   Heart sounds: Normal heart sounds. No murmur heard. ?Pulmonary:  ?   Effort: Pulmonary effort is normal. No respiratory distress.  ?   Breath sounds: Normal breath sounds. No decreased breath sounds or wheezing.  ?Abdominal:  ?   General: There is no distension.  ?Musculoskeletal:  ?   Cervical back: Neck supple.  ?Skin: ?   General: Skin is warm and dry.  ?   Capillary Refill: Capillary refill takes less than 2 seconds.  ?   Findings: No rash.  ?Neurological:  ?   Mental Status: He is alert and oriented to person, place, and time.  ?Psychiatric:     ?   Behavior: Behavior normal.  ? ? ?ED Results / Procedures / Treatments   ?Labs ?(all labs ordered are listed, but only abnormal results are displayed) ?Labs Reviewed - No data to display ? ?EKG ?None ? ?Radiology ?No results found. ? ?Procedures ?Procedures  ? ? ?Medications Ordered in ED ?Medications - No data to display ? ?ED Course/  Medical Decision Making/ A&P ?  ?                        ?Medical Decision Making ? ?26 year old male presents for refill of his albuterol inhaler, has been out for 1 week.  Patient with even and unlabored respirations, lungs clear to auscultation, no wheezing at this time, lung sounds not diminished.  Vitals reviewed and reassuring, O2 sats 100% on room air.  Patient's albuterol inhaler is refilled today as well as prescription for Zyrtec and Flonase.  Advised to start all medications today and follow-up with PCP with return to ER precautions. ? ? ? ? ? ? ? ?Final Clinical Impression(s) / ED Diagnoses ?Final diagnoses:  ?Mild intermittent asthma with exacerbation  ? ? ?Rx / DC Orders ?ED Discharge Orders   ? ?      Ordered  ?  albuterol (VENTOLIN HFA) 108 (90  Base) MCG/ACT inhaler  Every 4 hours PRN       ? 01/10/22 1550  ?  cetirizine (ZYRTEC ALLERGY) 10 MG tablet  Daily       ? 01/10/22 1550  ?  fluticasone (FLONASE) 50 MCG/ACT nasal spray  Daily       ? 01/10/22 1550  ? ?  ?  ? ?  ? ? ?  ?Jeannie Fend, PA-C ?01/10/22 1552 ? ?  ?Lorre Nick, MD ?01/10/22 1605 ? ?

## 2022-01-25 ENCOUNTER — Other Ambulatory Visit: Payer: Self-pay

## 2022-01-25 ENCOUNTER — Emergency Department (HOSPITAL_COMMUNITY)
Admission: EM | Admit: 2022-01-25 | Discharge: 2022-01-25 | Disposition: A | Discharge status: Home / Self Care | Payer: Self-pay | Attending: Emergency Medicine | Admitting: Emergency Medicine

## 2022-01-25 ENCOUNTER — Encounter (HOSPITAL_COMMUNITY): Payer: Self-pay

## 2022-01-25 DIAGNOSIS — Z7951 Long term (current) use of inhaled steroids: Secondary | ICD-10-CM | POA: Insufficient documentation

## 2022-01-25 DIAGNOSIS — J45901 Unspecified asthma with (acute) exacerbation: Secondary | ICD-10-CM | POA: Insufficient documentation

## 2022-01-25 MED ORDER — IPRATROPIUM BROMIDE 0.02 % IN SOLN
0.5000 mg | Freq: Once | RESPIRATORY_TRACT | Status: AC
  Administered 2022-01-25: 0.5 mg via RESPIRATORY_TRACT
  Filled 2022-01-25: qty 2.5

## 2022-01-25 MED ORDER — ALBUTEROL SULFATE HFA 108 (90 BASE) MCG/ACT IN AERS
2.0000 | INHALATION_SPRAY | RESPIRATORY_TRACT | Status: DC | PRN
  Administered 2022-01-25: 2 via RESPIRATORY_TRACT
  Filled 2022-01-25: qty 6.7

## 2022-01-25 MED ORDER — ALBUTEROL SULFATE (2.5 MG/3ML) 0.083% IN NEBU
5.0000 mg | INHALATION_SOLUTION | Freq: Once | RESPIRATORY_TRACT | Status: AC
  Administered 2022-01-25: 5 mg via RESPIRATORY_TRACT
  Filled 2022-01-25: qty 6

## 2022-01-25 MED ORDER — PREDNISONE 20 MG PO TABS: 40.0000 mg | ORAL_TABLET | Freq: Every day | ORAL | 0 refills | Status: DC

## 2022-01-25 MED ORDER — PREDNISONE 20 MG PO TABS
60.0000 mg | ORAL_TABLET | Freq: Once | ORAL | Status: AC
  Administered 2022-01-25: 60 mg via ORAL
  Filled 2022-01-25: qty 3

## 2022-01-25 NOTE — ED Triage Notes (Addendum)
Patient c/o asthma being worse since yesterday. Patient states that he has been using his albuterol inhaler with very little relief.  Patient states that he has been sleeping with the window open and allergies is a trigger for his asthma. Patiaeant speaking in complete sentences and Sats 94% on room air.

## 2022-01-25 NOTE — Discharge Instructions (Addendum)
Please read and follow all provided instructions.  Your diagnoses today include:  1. Exacerbation of asthma, unspecified asthma severity, unspecified whether persistent    Tests performed today include: Vital signs. See below for your results today.   Medications prescribed:  Prednisone - steroid medicine   It is best to take this medication in the morning to prevent sleeping problems. If you are diabetic, monitor your blood sugar closely and stop taking Prednisone if blood sugar is over 300. Take with food to prevent stomach upset.   Take any prescribed medications only as directed.  Home care instructions:  Follow any educational materials contained in this packet.  Follow-up instructions: Please follow-up with your primary care provider in the next 3 days for further evaluation of your symptoms and management of your asthma.  Return instructions:  Please return to the Emergency Department if you experience worsening symptoms. Please return with worsening wheezing, shortness of breath, or difficulty breathing. Return with persistent fever above 101F.  Please return if you have any other emergent concerns.  Additional Information:  Your vital signs today were: BP (!) 173/108 (BP Location: Left Arm)   Pulse 76   Temp 98.4 F (36.9 C) (Oral)   Resp 18   Ht 5\' 5"  (1.651 m)   Wt 99.8 kg   SpO2 94%   BMI 36.61 kg/m  If your blood pressure (BP) was elevated above 135/85 this visit, please have this repeated by your doctor within one month. --------------

## 2022-01-25 NOTE — ED Provider Notes (Signed)
[facility]-EMERGENCY DEPT Provider Note   CSN: [id number] Arrival date & time: 01/25/22  1435     History  Chief Complaint  Patient presents with   Asthma    Joshua Roach M Broxton is a 26 y.o. male.  Patient presents to the emergency department for evaluation of chest tightness and shortness of breath.  Patient has a history of allergies and asthma.  He states that he fell asleep yesterday with the window open and awoke several times over the night with chest tightness, wheezing, and shortness of breath.  He has been taking 2 puffs from his inhaler, multiple times without much improvement.  He denies preceding fevers, ear pain, runny nose, sore throat or cough.  Currently not taking other breathing medications.      Home Medications Prior to Admission medications   Medication Sig Start Date End Date Taking? Authorizing Provider  acetaminophen (TYLENOL) 325 MG tablet Take 650 mg by mouth every 6 (six) hours as needed for mild pain.    [provider]  albuterol (VENTOLIN HFA) 108 (90 Base) MCG/ACT inhaler Inhale 2 puffs into the lungs every 4 (four) hours as needed for wheezing or shortness of breath. 01/10/22   Jeannie FendMurphy, Laura A, PA-C  cetirizine (ZYRTEC ALLERGY) 10 MG tablet Take 1 tablet (10 mg total) by mouth daily. 01/10/22   Jeannie FendMurphy, Laura A, PA-C  fluticasone (FLONASE) 50 MCG/ACT nasal spray Place 1 spray into both nostrils daily. 01/10/22   Jeannie FendMurphy, Laura A, PA-C  ondansetron (ZOFRAN ODT) 4 MG disintegrating tablet Take 1 tablet (4 mg total) by mouth every 8 (eight) hours as needed for nausea or vomiting. 08/13/19   Milagros Lollykstra, Richard S, MD  pantoprazole (PROTONIX) 40 MG tablet Take 1 tablet (40 mg total) by mouth daily. 08/13/19 09/12/19  Milagros Lollykstra, Richard S, MD  predniSONE (DELTASONE) 10 MG tablet Take 2 tablets (20 mg total) by mouth daily. 06/09/21   Theron AristaSage, Haley, PA-C      Allergies    Patient has no known allergies.    Review of Systems   Review of  Systems  Physical Exam Updated Vital Signs BP (!) 173/108 (BP Location: Left Arm)   Pulse 76   Temp 98.4 F (36.9 C) (Oral)   Resp 18   Ht 5\' 5"  (1.651 m)   Wt 99.8 kg   SpO2 94%   BMI 36.61 kg/m   Physical Exam Vitals and nursing note reviewed.  Constitutional:      General: He is not in acute distress.    Appearance: He is well-developed.  HENT:     Head: Normocephalic and atraumatic.     Right Ear: Tympanic membrane, ear canal and external ear normal.     Left Ear: Tympanic membrane, ear canal and external ear normal.     Nose: Nose normal.     Mouth/Throat:     Mouth: Mucous membranes are moist.     Pharynx: No oropharyngeal exudate.  Eyes:     General:        Right eye: No discharge.        Left eye: No discharge.     Conjunctiva/sclera: Conjunctivae normal.  Cardiovascular:     Rate and Rhythm: Normal rate and regular rhythm.     Heart sounds: Normal heart sounds.  Pulmonary:     Effort: Pulmonary effort is normal.     Breath sounds: Wheezing present. No rhonchi or rales.     Comments: Minimal wheezing, slightly diminished breath sounds bilaterally.  Patient is not in any distress and is not using accessory muscles.  Abdominal:     Palpations: Abdomen is soft.     Tenderness: There is no abdominal tenderness.  Musculoskeletal:     Cervical back: Normal range of motion and neck supple.  Skin:    General: Skin is warm and dry.  Neurological:     Mental Status: He is alert.    ED Results / Procedures / Treatments   Labs (all labs ordered are listed, but only abnormal results are displayed) Labs Reviewed - No data to display  EKG None  Radiology No results found.  Procedures Procedures    Medications Ordered in ED Medications  albuterol (VENTOLIN HFA) 108 (90 Base) MCG/ACT inhaler 2 puff (has no administration in time range)  albuterol (PROVENTIL) (2.5 MG/3ML) 0.083% nebulizer solution 5 mg (has no administration in time range)  ipratropium  (ATROVENT) nebulizer solution 0.5 mg (has no administration in time range)  predniSONE (DELTASONE) tablet 60 mg (has no administration in time range)    ED Course/ Medical Decision Making/ A&P    Patient seen and examined. History obtained directly from patient.   Labs/EKG: None ordered.  Imaging: Considered chest x-ray however no focal concerning lung findings, normal vital signs, no fevers or other concerning features suspicious for pneumonia.  Medications/Fluids: Albuterol nebulizer 5 mg, Atrovent nebulizer 0.5 mg, prednisone 60 mg p.o.  Most recent vital signs reviewed and are as follows: BP (!) 173/108 (BP Location: Left Arm)   Pulse 76   Temp 98.4 F (36.9 C) (Oral)   Resp 18   Ht 5\' 5"  (1.651 m)   Wt 99.8 kg   SpO2 94%   BMI 36.61 kg/m   Initial impression: Asthma exacerbation  3:51 PM Reassessment performed. Patient appears comfortable.  States that he feels much better after albuterol nebulizer treatment.  Plan: Discharge to home.   Prescriptions written for: Prednisone, home with albuterol  Other home care instructions discussed: Avoidance of triggers  ED return instructions discussed: Return with worsening shortness of breath, trouble breathing, increased work of breathing, fever or other concerns  Follow-up instructions discussed: Patient encouraged to follow-up with their PCP in 3 days.                               Medical Decision Making Risk Prescription drug management.   Patient presents with asthma exacerbation.  This is manifesting as chest tightness and wheezing.  He does not have fever or persistent cough to suggest pneumonia.  He appears well, nontoxic.  No significant chest pain to suggest ACS, PE.  He feels much better after breathing treatment and is stable for discharge.  The patient's vital signs, pertinent lab work and imaging were reviewed and interpreted as discussed in the ED course. Hospitalization was considered for further  testing, treatments, or serial exams/observation. However as patient is well-appearing, has a stable exam, and reassuring studies today, I do not feel that they warrant admission at this time. This plan was discussed with the patient who verbalizes agreement and comfort with this plan and seems reliable and able to return to the Emergency Department with worsening or changing symptoms.          Final Clinical Impression(s) / ED Diagnoses Final diagnoses:  Exacerbation of asthma, unspecified asthma severity, unspecified whether persistent    Rx / DC Orders ED Discharge Orders  Ordered    predniSONE (DELTASONE) 20 MG tablet  Daily        01/25/22 1550              Renne Crigler, PA-C 01/25/22 1552    Cathren Laine, MD 01/25/22 2141

## 2023-07-03 ENCOUNTER — Other Ambulatory Visit: Payer: Self-pay

## 2023-07-03 ENCOUNTER — Encounter (HOSPITAL_COMMUNITY): Payer: Self-pay | Admitting: Emergency Medicine

## 2023-07-03 ENCOUNTER — Emergency Department (HOSPITAL_COMMUNITY)
Admission: EM | Admit: 2023-07-03 | Discharge: 2023-07-03 | Disposition: A | Discharge status: Home / Self Care | Payer: Self-pay | Attending: Emergency Medicine | Admitting: Emergency Medicine

## 2023-07-03 DIAGNOSIS — L03116 Cellulitis of left lower limb: Secondary | ICD-10-CM | POA: Insufficient documentation

## 2023-07-03 LAB — CBG MONITORING, ED: Glucose-Capillary: 121 mg/dL — ABNORMAL HIGH (ref 70–99)

## 2023-07-03 MED ORDER — CEPHALEXIN 500 MG PO CAPS
500.0000 mg | ORAL_CAPSULE | Freq: Once | ORAL | Status: AC
  Administered 2023-07-03: 500 mg via ORAL
  Filled 2023-07-03: qty 1

## 2023-07-03 MED ORDER — CEPHALEXIN 500 MG PO CAPS: 500.0000 mg | ORAL_CAPSULE | Freq: Two times a day (BID) | ORAL | 0 refills | Status: AC

## 2023-07-03 NOTE — ED Provider Notes (Signed)
[place] EMERGENCY DEPARTMENT AT [facility] Provider Note   CSN: [id number] Arrival date & time: 07/03/23  1059     History  Chief Complaint  Patient presents with   leg redness    Joshua Roach is a 27 y.o. male.  HPI Patient presents with concern of painful painful erythematous lesion in the left anterior shin.  Onset was this morning.  He notes history of eczema, ongoing efforts to establish with a primary.  He has been in his usual state of health, until onset.  He does have an open wound on the left lateral calf, this is been present for about 1 year. No fevers, chills, nausea, vomiting, distal loss of sensation or weakness.    Home Medications Prior to Admission medications   Medication Sig Start Date End Date Taking? Authorizing Provider  cephALEXin (KEFLEX) 500 MG capsule Take 1 capsule (500 mg total) by mouth 2 (two) times daily for 7 days. 07/03/23 07/10/23 Yes Gerhard Munch, MD  acetaminophen (TYLENOL) 325 MG tablet Take 650 mg by mouth every 6 (six) hours as needed for mild pain.    [provider]  albuterol (VENTOLIN HFA) 108 (90 Base) MCG/ACT inhaler Inhale 2 puffs into the lungs every 4 (four) hours as needed for wheezing or shortness of breath. 01/10/22   Jeannie Fend, PA-C  cetirizine (ZYRTEC ALLERGY) 10 MG tablet Take 1 tablet (10 mg total) by mouth daily. 01/10/22   Jeannie Fend, PA-C  fluticasone (FLONASE) 50 MCG/ACT nasal spray Place 1 spray into both nostrils daily. 01/10/22   Jeannie Fend, PA-C  predniSONE (DELTASONE) 20 MG tablet Take 2 tablets (40 mg total) by mouth daily. 01/25/22   Renne Crigler, PA-C      Allergies    Patient has no known allergies.    Review of Systems   Review of Systems  Physical Exam Updated Vital Signs BP (!) 155/98 (BP Location: Right Arm)   Pulse 81   Temp 98.4 F (36.9 C) (Oral)   Resp 18   Ht 5\' 5"  (1.651 m)   Wt 93.4 kg   SpO2 98%   BMI 34.28 kg/m  Physical Exam Vitals and nursing  note reviewed.  Constitutional:      General: He is not in acute distress.    Appearance: He is well-developed.  HENT:     Head: Normocephalic and atraumatic.  Eyes:     Conjunctiva/sclera: Conjunctivae normal.  Cardiovascular:     Rate and Rhythm: Normal rate and regular rhythm.     Pulses: Normal pulses.  Pulmonary:     Effort: Pulmonary effort is normal. No respiratory distress.     Breath sounds: No stridor.  Abdominal:     General: There is no distension.  Skin:    General: Skin is warm and dry.       Neurological:     Mental Status: He is alert and oriented to person, place, and time.     ED Results / Procedures / Treatments   Labs (all labs ordered are listed, but only abnormal results are displayed) Labs Reviewed  CBG MONITORING, ED - Abnormal; Notable for the following components:      Result Value   Glucose-Capillary 121 (*)    All other components within normal limits    EKG None  Radiology No results found.  Procedures Procedures    Medications Ordered in ED Medications  cephALEXin (KEFLEX) capsule 500 mg (has no administration in time range)  ED Course/ Medical Decision Making/ A&P                                 Medical Decision Making Adult male with eczema presents with cutaneous lesion concerning for cellulitis.  Patient is distally neurovascularly intact, has no other complaints suggestive bacteremia, sepsis.  He is afebrile, hemodynamically unremarkable, appropriate for oral antibiotics, discharged with primary care follow-up.  Risk Prescription drug management.  Final Clinical Impression(s) / ED Diagnoses Final diagnoses:  Cellulitis of left lower extremity    Rx / DC Orders ED Discharge Orders          Ordered    cephALEXin (KEFLEX) 500 MG capsule  2 times daily        07/03/23 1123              Gerhard Munch, MD 07/03/23 1127

## 2023-07-03 NOTE — Discharge Instructions (Signed)
As discussed, you have been diagnosed with cellulitis.  Typically this improves with antibiotics.  However, with your eczema it is important to follow-up with your physician.  Return here for concerning changes in your condition.

## 2023-07-03 NOTE — ED Triage Notes (Signed)
Patient arrives ambulatory by POV c/o left lower leg redness since this morning. Patient has old wound to left side of same leg that has been there for over a year that has not been evaluated. Patient denies any injury to leg.

## 2023-07-19 ENCOUNTER — Emergency Department (HOSPITAL_COMMUNITY): Payer: Self-pay

## 2023-07-19 ENCOUNTER — Emergency Department (HOSPITAL_COMMUNITY)
Admission: EM | Admit: 2023-07-19 | Discharge: 2023-07-19 | Disposition: A | Discharge status: Home / Self Care | Payer: Self-pay | Attending: Emergency Medicine | Admitting: Emergency Medicine

## 2023-07-19 ENCOUNTER — Encounter (HOSPITAL_COMMUNITY): Payer: Self-pay

## 2023-07-19 DIAGNOSIS — J4521 Mild intermittent asthma with (acute) exacerbation: Secondary | ICD-10-CM | POA: Insufficient documentation

## 2023-07-19 DIAGNOSIS — Z7951 Long term (current) use of inhaled steroids: Secondary | ICD-10-CM | POA: Insufficient documentation

## 2023-07-19 MED ORDER — ALBUTEROL SULFATE HFA 108 (90 BASE) MCG/ACT IN AERS: 2.0000 | INHALATION_SPRAY | RESPIRATORY_TRACT | 1 refills | Status: DC | PRN

## 2023-07-19 MED ORDER — ALBUTEROL SULFATE HFA 108 (90 BASE) MCG/ACT IN AERS
2.0000 | INHALATION_SPRAY | RESPIRATORY_TRACT | Status: DC | PRN
  Administered 2023-07-19: 2 via RESPIRATORY_TRACT
  Filled 2023-07-19: qty 6.7

## 2023-07-19 NOTE — Discharge Instructions (Signed)
Your exam today was reassuring.  Continue using albuterol inhaler as needed.  For any concerning symptoms return to the emergency room.

## 2023-07-19 NOTE — ED Provider Notes (Signed)
[facility] Provider Note   CSN: [id number] Arrival date & time: 07/19/23  1147     History  Chief Complaint  Patient presents with   Shortness of Breath    Joshua Roach is a 27 y.o. male.  27 year old male presents today for concern of asthma exacerbation.  He has history of asthma.  He states that he woke up with some wheezing but did not have an albuterol inhaler on hand.  He used his mom's and came to the emergency department.  Reports he has had significant improvement since using the rescue inhaler from his mom.  No chest pain or other concerns.  The history is provided by the patient. No language interpreter was used.       Home Medications Prior to Admission medications   Medication Sig Start Date End Date Taking? Authorizing Provider  acetaminophen (TYLENOL) 325 MG tablet Take 650 mg by mouth every 6 (six) hours as needed for mild pain.    [provider]  albuterol (VENTOLIN HFA) 108 (90 Base) MCG/ACT inhaler Inhale 2 puffs into the lungs every 4 (four) hours as needed for wheezing or shortness of breath. 07/19/23   Marita Kansas, PA-C  cetirizine (ZYRTEC ALLERGY) 10 MG tablet Take 1 tablet (10 mg total) by mouth daily. 01/10/22   Jeannie Fend, PA-C  fluticasone (FLONASE) 50 MCG/ACT nasal spray Place 1 spray into both nostrils daily. 01/10/22   Jeannie Fend, PA-C  predniSONE (DELTASONE) 20 MG tablet Take 2 tablets (40 mg total) by mouth daily. 01/25/22   Renne Crigler, PA-C      Allergies    Patient has no known allergies.    Review of Systems   Review of Systems  Constitutional:  Negative for chills and fever.  Respiratory:  Positive for wheezing. Negative for shortness of breath.   Cardiovascular:  Negative for chest pain.  Neurological:  Negative for light-headedness.  All other systems reviewed and are negative.   Physical Exam Updated Vital Signs BP 131/87 (BP Location: Left Arm)   Pulse 70    Temp 98.1 F (36.7 C) (Oral)   Resp 18   Ht 5\' 5"  (1.651 m)   Wt 93.4 kg   SpO2 95%   BMI 34.28 kg/m  Physical Exam Vitals and nursing note reviewed.  Constitutional:      General: He is not in acute distress.    Appearance: Normal appearance. He is not ill-appearing.  HENT:     Head: Normocephalic and atraumatic.     Nose: Nose normal.  Eyes:     General: No scleral icterus.    Extraocular Movements: Extraocular movements intact.     Conjunctiva/sclera: Conjunctivae normal.  Cardiovascular:     Rate and Rhythm: Normal rate and regular rhythm.     Heart sounds: Normal heart sounds.  Pulmonary:     Effort: Pulmonary effort is normal. No respiratory distress.     Breath sounds: Wheezing present. No rales.  Abdominal:     General: There is no distension.     Tenderness: There is no abdominal tenderness.  Musculoskeletal:        General: No deformity. Normal range of motion.     Cervical back: Normal range of motion.  Skin:    General: Skin is warm and dry.     Findings: No rash.  Neurological:     General: No focal deficit present.     Mental Status: He is alert.  Mental status is at baseline.     ED Results / Procedures / Treatments   Labs (all labs ordered are listed, but only abnormal results are displayed) Labs Reviewed - No data to display  EKG None  Radiology No results found.  Procedures Procedures    Medications Ordered in ED Medications  albuterol (VENTOLIN HFA) 108 (90 Base) MCG/ACT inhaler 2 puff (has no administration in time range)    ED Course/ Medical Decision Making/ A&P                                 Medical Decision Making Amount and/or Complexity of Data Reviewed Radiology: ordered.  Risk Prescription drug management.   27 year old male presents today for concern of asthma exacerbation.  He states has significantly improved since using rescue inhaler.  He was given a rescue inhaler in the department.  Will give refill as well.   He does have some faint wheezing but he is overall moving good air.  Patient appears comfortable with discharge and return for any concerning symptoms.  Discharged in stable condition.   Final Clinical Impression(s) / ED Diagnoses Final diagnoses:  Mild intermittent asthma with exacerbation    Rx / DC Orders ED Discharge Orders          Ordered    albuterol (VENTOLIN HFA) 108 (90 Base) MCG/ACT inhaler  Every 4 hours PRN        07/19/23 1322              Marita Kansas, PA-C 07/19/23 1337    Benjiman Core, MD 07/19/23 [phone]

## 2023-07-19 NOTE — ED Triage Notes (Signed)
Pt c/o SOB and congested cough starting this morning.  Hx of Asthma and reports he is out of his inhaler.   NAD noted.  Pt is easily speaking full sentences.

## 2023-07-26 ENCOUNTER — Emergency Department (HOSPITAL_COMMUNITY)
Admission: EM | Admit: 2023-07-26 | Discharge: 2023-07-26 | Disposition: A | Discharge status: Home / Self Care | Payer: Self-pay | Attending: Emergency Medicine | Admitting: Emergency Medicine

## 2023-07-26 ENCOUNTER — Encounter (HOSPITAL_COMMUNITY): Payer: Self-pay

## 2023-07-26 ENCOUNTER — Other Ambulatory Visit: Payer: Self-pay

## 2023-07-26 DIAGNOSIS — J45901 Unspecified asthma with (acute) exacerbation: Secondary | ICD-10-CM

## 2023-07-26 DIAGNOSIS — R0602 Shortness of breath: Secondary | ICD-10-CM | POA: Insufficient documentation

## 2023-07-26 DIAGNOSIS — Z7951 Long term (current) use of inhaled steroids: Secondary | ICD-10-CM | POA: Insufficient documentation

## 2023-07-26 DIAGNOSIS — R059 Cough, unspecified: Secondary | ICD-10-CM | POA: Insufficient documentation

## 2023-07-26 DIAGNOSIS — J45909 Unspecified asthma, uncomplicated: Secondary | ICD-10-CM | POA: Insufficient documentation

## 2023-07-26 MED ORDER — ALBUTEROL SULFATE HFA 108 (90 BASE) MCG/ACT IN AERS
2.0000 | INHALATION_SPRAY | RESPIRATORY_TRACT | Status: DC
  Administered 2023-07-26: 2 via RESPIRATORY_TRACT
  Filled 2023-07-26: qty 6.7

## 2023-07-26 MED ORDER — ALBUTEROL SULFATE HFA 108 (90 BASE) MCG/ACT IN AERS: 2.0000 | INHALATION_SPRAY | RESPIRATORY_TRACT | 0 refills | Status: DC | PRN

## 2023-07-26 MED ORDER — PREDNISONE 50 MG PO TABS: ORAL_TABLET | ORAL | 0 refills | Status: AC

## 2023-07-26 MED ORDER — PREDNISONE 20 MG PO TABS
60.0000 mg | ORAL_TABLET | Freq: Once | ORAL | Status: AC
  Administered 2023-07-26: 60 mg via ORAL
  Filled 2023-07-26: qty 3

## 2023-07-26 MED ORDER — ALBUTEROL SULFATE (2.5 MG/3ML) 0.083% IN NEBU
10.0000 mg | INHALATION_SOLUTION | Freq: Once | RESPIRATORY_TRACT | Status: AC
  Administered 2023-07-26: 10 mg via RESPIRATORY_TRACT
  Filled 2023-07-26: qty 12

## 2023-07-26 MED ORDER — IPRATROPIUM BROMIDE 0.02 % IN SOLN
0.5000 mg | Freq: Once | RESPIRATORY_TRACT | Status: AC
  Administered 2023-07-26: 0.5 mg via RESPIRATORY_TRACT
  Filled 2023-07-26: qty 2.5

## 2023-07-26 NOTE — ED Triage Notes (Signed)
Patient stated he woke up this morning feeling short of breath. Had a coughing attack and every time he coughed he could not catch air. Dark brown flim. Attempted 4 puffs of albuterol and said it is not improving. No chest pain.

## 2023-07-26 NOTE — ED Provider Notes (Signed)
[place] EMERGENCY DEPARTMENT AT [facility] Provider Note   CSN: [id number] Arrival date & time: 07/26/23  1056     History  Chief Complaint  Patient presents with   Shortness of Breath    ISSAI Roach is a 27 y.o. male.  27 year old male with history of asthma presents with shortness of breath.  States this felt similar to his prior asthma exacerbations.  Denies any fever.  Cough is been nonproductive.  Used his home inhaler without relief.  No URI symptoms.       Home Medications Prior to Admission medications   Medication Sig Start Date End Date Taking? Authorizing Provider  acetaminophen (TYLENOL) 325 MG tablet Take 650 mg by mouth every 6 (six) hours as needed for mild pain.    [provider]  albuterol (VENTOLIN HFA) 108 (90 Base) MCG/ACT inhaler Inhale 2 puffs into the lungs every 4 (four) hours as needed for wheezing or shortness of breath. 07/19/23   Marita Kansas, PA-C  cetirizine (ZYRTEC ALLERGY) 10 MG tablet Take 1 tablet (10 mg total) by mouth daily. 01/10/22   Jeannie Fend, PA-C  fluticasone (FLONASE) 50 MCG/ACT nasal spray Place 1 spray into both nostrils daily. 01/10/22   Jeannie Fend, PA-C  predniSONE (DELTASONE) 20 MG tablet Take 2 tablets (40 mg total) by mouth daily. 01/25/22   Renne Crigler, PA-C      Allergies    Patient has no known allergies.    Review of Systems   Review of Systems  All other systems reviewed and are negative.   Physical Exam Updated Vital Signs BP (!) 167/118   Pulse 85   Temp 98.4 F (36.9 C) (Oral)   Resp (!) 24   Ht 1.651 m (5\' 5" )   Wt 93 kg   SpO2 97%   BMI 34.11 kg/m  Physical Exam Vitals and nursing note reviewed.  Constitutional:      General: He is not in acute distress.    Appearance: Normal appearance. He is well-developed. He is not toxic-appearing.  HENT:     Head: Normocephalic and atraumatic.  Eyes:     General: Lids are normal.     Conjunctiva/sclera: Conjunctivae normal.      Pupils: Pupils are equal, round, and reactive to light.  Neck:     Thyroid: No thyroid mass.     Trachea: No tracheal deviation.  Cardiovascular:     Rate and Rhythm: Normal rate and regular rhythm.     Heart sounds: Normal heart sounds. No murmur heard.    No gallop.  Pulmonary:     Effort: Pulmonary effort is normal. No respiratory distress.     Breath sounds: Normal breath sounds. No stridor. No decreased breath sounds, wheezing, rhonchi or rales.  Abdominal:     General: There is no distension.     Palpations: Abdomen is soft.     Tenderness: There is no abdominal tenderness. There is no rebound.  Musculoskeletal:        General: No tenderness. Normal range of motion.     Cervical back: Normal range of motion and neck supple.  Skin:    General: Skin is warm and dry.     Findings: No abrasion or rash.  Neurological:     Mental Status: He is alert and oriented to person, place, and time. Mental status is at baseline.     GCS: GCS eye subscore is 4. GCS verbal subscore is 5. GCS motor subscore  is 6.     Cranial Nerves: Cranial nerves are intact. No cranial nerve deficit.     Sensory: No sensory deficit.     Motor: Motor function is intact.  Psychiatric:        Attention and Perception: Attention normal.        Speech: Speech normal.        Behavior: Behavior normal.     ED Results / Procedures / Treatments   Labs (all labs ordered are listed, but only abnormal results are displayed) Labs Reviewed - No data to display  EKG None  Radiology No results found.  Procedures Procedures    Medications Ordered in ED Medications  albuterol (PROVENTIL,VENTOLIN) solution continuous neb (has no administration in time range)  ipratropium (ATROVENT) nebulizer solution 0.5 mg (has no administration in time range)  predniSONE (DELTASONE) tablet 60 mg (has no administration in time range)    ED Course/ Medical Decision Making/ A&P                                 Medical  Decision Making Risk Prescription drug management.   Patient given albuterol 10 mg to his treatment along with prednisone and Atrovent.  Breathing is improved greatly.  Reassessed and wheezing is much better.  Will be discharged home with albuterol inhaler with course of prednisone        Final Clinical Impression(s) / ED Diagnoses Final diagnoses:  None    Rx / DC Orders ED Discharge Orders     None         Lorre Nick, MD 07/26/23 1310

## 2023-10-26 ENCOUNTER — Other Ambulatory Visit: Payer: Self-pay

## 2023-10-26 ENCOUNTER — Emergency Department (HOSPITAL_COMMUNITY)
Admission: EM | Admit: 2023-10-26 | Discharge: 2023-10-26 | Disposition: A | Discharge status: Home / Self Care | Payer: Self-pay | Attending: Emergency Medicine | Admitting: Emergency Medicine

## 2023-10-26 DIAGNOSIS — J45901 Unspecified asthma with (acute) exacerbation: Secondary | ICD-10-CM | POA: Insufficient documentation

## 2023-10-26 DIAGNOSIS — Z7951 Long term (current) use of inhaled steroids: Secondary | ICD-10-CM | POA: Insufficient documentation

## 2023-10-26 MED ORDER — IPRATROPIUM-ALBUTEROL 0.5-2.5 (3) MG/3ML IN SOLN: 3.0000 mL | Freq: Four times a day (QID) | RESPIRATORY_TRACT | 1 refills | Status: AC | PRN

## 2023-10-26 MED ORDER — IPRATROPIUM-ALBUTEROL 0.5-2.5 (3) MG/3ML IN SOLN
3.0000 mL | Freq: Once | RESPIRATORY_TRACT | Status: AC
  Administered 2023-10-26: 3 mL via RESPIRATORY_TRACT
  Filled 2023-10-26: qty 3

## 2023-10-26 MED ORDER — PREDNISONE 10 MG (21) PO TBPK: ORAL_TABLET | Freq: Every day | ORAL | 0 refills | Status: AC

## 2023-10-26 MED ORDER — PREDNISONE 20 MG PO TABS
60.0000 mg | ORAL_TABLET | Freq: Once | ORAL | Status: AC
  Administered 2023-10-26: 60 mg via ORAL
  Filled 2023-10-26: qty 3

## 2023-10-26 MED ORDER — ALBUTEROL SULFATE HFA 108 (90 BASE) MCG/ACT IN AERS: 1.0000 | INHALATION_SPRAY | Freq: Four times a day (QID) | RESPIRATORY_TRACT | 2 refills | Status: DC | PRN

## 2023-10-26 NOTE — ED Provider Notes (Signed)
 [place] EMERGENCY DEPARTMENT AT [facility] Provider Note   CSN: [id number] Arrival date & time: 10/26/23  1134     History  Chief Complaint  Patient presents with   Shortness of Breath    Joshua Roach is a 28 y.o. male past medical history significant for asthma presents today for shortness of breath that began when he woke up.  Patient states that he is out of his breathing treatments.  Patient has been out of his medication for approximately 1 week.  Patient denies fever, chills, cough, congestion, chest pain, leg swelling, recent travel or surgery, hemoptysis, hormone use, or previous blood clot.   Shortness of Breath      Home Medications Prior to Admission medications   Medication Sig Start Date End Date Taking? Authorizing Provider  albuterol (VENTOLIN HFA) 108 (90 Base) MCG/ACT inhaler Inhale 1-2 puffs into the lungs every 6 (six) hours as needed for wheezing or shortness of breath. 10/26/23  Yes Dolphus Jenny, PA-C  ipratropium-albuterol (DUONEB) 0.5-2.5 (3) MG/3ML SOLN Take 3 mLs by nebulization every 6 (six) hours as needed. 10/26/23  Yes Dolphus Jenny, PA-C  predniSONE (STERAPRED UNI-PAK 21 TAB) 10 MG (21) TBPK tablet Take by mouth daily. Take 6 tabs by mouth daily  for 2 days, then 5 tabs for 2 days, then 4 tabs for 2 days, then 3 tabs for 2 days, 2 tabs for 2 days, then 1 tab by mouth daily for 2 days 10/26/23  Yes Dolphus Jenny, PA-C  acetaminophen (TYLENOL) 325 MG tablet Take 650 mg by mouth every 6 (six) hours as needed for mild pain.    [provider]  cetirizine (ZYRTEC ALLERGY) 10 MG tablet Take 1 tablet (10 mg total) by mouth daily. 01/10/22   Jeannie Fend, PA-C  fluticasone (FLONASE) 50 MCG/ACT nasal spray Place 1 spray into both nostrils daily. 01/10/22   Jeannie Fend, PA-C      Allergies    Patient has no known allergies.    Review of Systems   Review of Systems  Respiratory:  Positive for shortness of breath.     Physical  Exam Updated Vital Signs BP (!) 142/86 (BP Location: Right Arm)   Pulse 80   Temp 97.9 F (36.6 C) (Oral)   Resp 19   Ht 5\' 5"  (1.651 m)   Wt 97.5 kg   SpO2 97%   BMI 35.78 kg/m  Physical Exam Vitals and nursing note reviewed.  Constitutional:      General: He is not in acute distress.    Appearance: He is well-developed. He is not ill-appearing, toxic-appearing or diaphoretic.  HENT:     Head: Normocephalic and atraumatic.     Mouth/Throat:     Mouth: Mucous membranes are moist.  Eyes:     Extraocular Movements: Extraocular movements intact.     Conjunctiva/sclera: Conjunctivae normal.  Cardiovascular:     Rate and Rhythm: Normal rate and regular rhythm.     Pulses: Normal pulses.     Heart sounds: Normal heart sounds. No murmur heard. Pulmonary:     Effort: Pulmonary effort is normal. No tachypnea, accessory muscle usage or respiratory distress.     Breath sounds: Decreased breath sounds present.  Chest:     Chest wall: No tenderness.  Abdominal:     Palpations: Abdomen is soft.     Tenderness: There is no abdominal tenderness.  Musculoskeletal:        General: No swelling.  Cervical back: Neck supple.     Right lower leg: No tenderness. No edema.     Left lower leg: No tenderness. No edema.  Skin:    General: Skin is warm and dry.     Capillary Refill: Capillary refill takes less than 2 seconds.  Neurological:     General: No focal deficit present.     Mental Status: He is alert.     Motor: No weakness.  Psychiatric:        Mood and Affect: Mood normal.     ED Results / Procedures / Treatments   Labs (all labs ordered are listed, but only abnormal results are displayed) Labs Reviewed - No data to display  EKG EKG Interpretation Date/Time:  Thursday October 26 2023 11:42:42 EST Ventricular Rate:  76 PR Interval:  142 QRS Duration:  93 QT Interval:  389 QTC Calculation: 438 R Axis:   93  Text Interpretation: Sinus rhythm Borderline right axis  deviation No significant change since last tracing Confirmed by Linwood Dibbles [phone]) on 10/26/2023 1:26:39 PM  Radiology No results found.  Procedures Procedures    Medications Ordered in ED Medications  ipratropium-albuterol (DUONEB) 0.5-2.5 (3) MG/3ML nebulizer solution 3 mL (3 mLs Nebulization Given 10/26/23 1300)  predniSONE (DELTASONE) tablet 60 mg (60 mg Oral Given 10/26/23 1229)    ED Course/ Medical Decision Making/ A&P                                 Medical Decision Making Risk Prescription drug management.   This patient presents to the ED with chief complaint(s) of [facility] with pertinent past medical history of asthma which further complicates the presenting complaint. The complaint involves an extensive differential diagnosis and also carries with it a high risk of complications and morbidity.    The differential diagnosis includes asthma exacerbation, COVID, flu, RSV, URI, PE, CHF  ED Course and Reassessment: PERC Rule for Pulmonary Embolism from [email or web address]  on 10/26/2023 ** All calculations should be rechecked by clinician prior to use **  RESULT SUMMARY: 0 criteria No need for further workup, as <2% chance of PE.  If no criteria are positive and clinician's pre-test probability is <15%, PERC Rule criteria are satisfied.   INPUTS: Age >=50 --> 0 = No HR >=100 --> 0 = No O? sat on room air <95% --> 0 = No Unilateral leg swelling --> 0 = No Hemoptysis --> 0 = No Recent surgery or trauma --> 0 = No Prior PE or DVT --> 0 = No Hormone use --> 0 = No  Patient given DuoNeb and 60 mg of prednisone Breath sounds improved and no wheezing heard after DuoNeb treatment  Consultation: - Consulted or discussed management/test interpretation w/ external professional: None  Consideration for admission or further workup: Considered for admission or further workup however patient's vital signs physical exams were reassuring.  Patient's symptoms likely due to not having his  asthma medications.  Patient given DuoNeb solution, albuterol inhaler, and prednisone taper for acute asthma exacerbation.  Patient should follow-up with his primary care in the upcoming weeks for further evaluation and treatment if symptoms persist.        Final Clinical Impression(s) / ED Diagnoses Final diagnoses:  Moderate asthma with exacerbation, unspecified whether persistent    Rx / DC Orders ED Discharge Orders          Ordered    ipratropium-albuterol (DUONEB) 0.5-2.5 (  3) MG/3ML SOLN  Every 6 hours PRN        10/26/23 1348    predniSONE (STERAPRED UNI-PAK 21 TAB) 10 MG (21) TBPK tablet  Daily        10/26/23 1348    albuterol (VENTOLIN HFA) 108 (90 Base) MCG/ACT inhaler  Every 6 hours PRN        10/26/23 1348              Dolphus Jenny, PA-C 10/26/23 1747    Linwood Dibbles, MD 10/27/23 [phone]

## 2023-10-26 NOTE — ED Triage Notes (Signed)
 Pt states he woke up feeling sob, pt has asthma and is out of his breathing treatments. Pt breathing unlabored and equal in triage.

## 2023-10-26 NOTE — Discharge Instructions (Signed)
 Today you are seen for an asthma exacerbation.  Please pick up your medications and take as prescribed.  Thank you for letting us treat you today. After performing a physical exam, I feel you are safe to go home. Please follow up with your PCP in the next several days and provide them with your records from this visit. Return to the Emergency Room if pain becomes severe or symptoms worsen.

## 2023-12-07 ENCOUNTER — Other Ambulatory Visit: Payer: Self-pay

## 2023-12-07 ENCOUNTER — Emergency Department (HOSPITAL_COMMUNITY)
Admission: EM | Admit: 2023-12-07 | Discharge: 2023-12-07 | Disposition: A | Discharge status: Home / Self Care | Payer: Self-pay | Attending: Emergency Medicine | Admitting: Emergency Medicine

## 2023-12-07 DIAGNOSIS — L02411 Cutaneous abscess of right axilla: Secondary | ICD-10-CM | POA: Insufficient documentation

## 2023-12-07 MED ORDER — DOXYCYCLINE HYCLATE 100 MG PO CAPS: 100.0000 mg | ORAL_CAPSULE | Freq: Two times a day (BID) | ORAL | 0 refills | Status: AC

## 2023-12-07 MED ORDER — LIDOCAINE-EPINEPHRINE (PF) 2 %-1:200000 IJ SOLN
10.0000 mL | Freq: Once | INTRAMUSCULAR | Status: AC
  Administered 2023-12-07: 10 mL
  Filled 2023-12-07: qty 20

## 2023-12-07 NOTE — ED Provider Notes (Signed)
 [place] EMERGENCY DEPARTMENT AT [facility] Provider Note   CSN: [id number] Arrival date & time: 12/07/23  6578     History  Chief Complaint  Patient presents with   Abscess    Joshua Roach is a 28 y.o. male.  Joshua Roach is a 28 y.o. male who presents for an area of pain and swelling in the right axilla.  He reports it started as a small bump 4 days ago but has become increasingly red swollen and painful.  He has not seen any drainage from the area.  He denies any history of similar.  No underlying history of diabetes.  Patient is concerned it may be an abscess and may need to be drained.  The history is provided by the patient and medical records.  Abscess Associated symptoms: no fever        Home Medications Prior to Admission medications   Medication Sig Start Date End Date Taking? Authorizing Provider  doxycycline (VIBRAMYCIN) 100 MG capsule Take 1 capsule (100 mg total) by mouth 2 (two) times daily. 12/07/23  Yes Rosezella Rumpf, PA-C  acetaminophen (TYLENOL) 325 MG tablet Take 650 mg by mouth every 6 (six) hours as needed for mild pain.    [provider]  albuterol (VENTOLIN HFA) 108 (90 Base) MCG/ACT inhaler Inhale 1-2 puffs into the lungs every 6 (six) hours as needed for wheezing or shortness of breath. 10/26/23   Dolphus Jenny, PA-C  cetirizine (ZYRTEC ALLERGY) 10 MG tablet Take 1 tablet (10 mg total) by mouth daily. 01/10/22   Jeannie Fend, PA-C  fluticasone (FLONASE) 50 MCG/ACT nasal spray Place 1 spray into both nostrils daily. 01/10/22   Jeannie Fend, PA-C  ipratropium-albuterol (DUONEB) 0.5-2.5 (3) MG/3ML SOLN Take 3 mLs by nebulization every 6 (six) hours as needed. 10/26/23   Dolphus Jenny, PA-C  predniSONE (STERAPRED UNI-PAK 21 TAB) 10 MG (21) TBPK tablet Take by mouth daily. Take 6 tabs by mouth daily  for 2 days, then 5 tabs for 2 days, then 4 tabs for 2 days, then 3 tabs for 2 days, 2 tabs for 2 days, then 1 tab by mouth daily for  2 days 10/26/23   Dolphus Jenny, PA-C      Allergies    Patient has no known allergies.    Review of Systems   Review of Systems  Constitutional:  Negative for chills and fever.  Skin:        Abscess    Physical Exam Updated Vital Signs BP (!) 139/90   Pulse 69   Temp 97.7 F (36.5 C) (Oral)   Resp 16   Ht 5\' 5"  (1.651 m)   Wt 97.5 kg   SpO2 95%   BMI 35.78 kg/m  Physical Exam Vitals and nursing note reviewed.  Constitutional:      General: He is not in acute distress.    Appearance: Normal appearance. He is well-developed. He is not ill-appearing or diaphoretic.  HENT:     Head: Normocephalic and atraumatic.  Eyes:     General:        Right eye: No discharge.        Left eye: No discharge.  Pulmonary:     Effort: Pulmonary effort is normal. No respiratory distress.  Musculoskeletal:     Comments: Right axilla with 4 x 3 cm area of fluctuance with overlying erythema and some mild surrounding induration.  No expressible drainage with palpation no  palpable lymphadenopathy or lymphangitic streaking.  Neurological:     Mental Status: He is alert and oriented to person, place, and time.     Coordination: Coordination normal.  Psychiatric:        Mood and Affect: Mood normal.        Behavior: Behavior normal.     ED Results / Procedures / Treatments   Labs (all labs ordered are listed, but only abnormal results are displayed) Labs Reviewed - No data to display  EKG None  Radiology No results found.  Procedures .Incision and Drainage  Date/Time: 12/07/2023 2:22 PM  Performed by: Rosezella Rumpf, PA-C Authorized by: Rosezella Rumpf, PA-C   Consent:    Consent obtained:  Verbal   Consent given by:  Patient   Risks, benefits, and alternatives were discussed: yes     Risks discussed:  Bleeding   Alternatives discussed:  No treatment Universal protocol:    Procedure explained and questions answered to patient or proxy's satisfaction: yes     Patient  identity confirmed:  Verbally with patient Location:    Type:  Abscess   Size:  4 x 3 cm   Location:  Upper extremity   Upper extremity location: R Axilla. Pre-procedure details:    Skin preparation:  Chlorhexidine with alcohol Sedation:    Sedation type:  None Anesthesia:    Anesthesia method:  Local infiltration   Local anesthetic:  Lidocaine 2% WITH epi Procedure type:    Complexity:  Simple Procedure details:    Ultrasound guidance: yes     Incision types:  Single straight   Incision depth:  Dermal   Wound management:  Probed and deloculated   Drainage:  Purulent and bloody   Drainage amount:  Copious   Wound treatment:  Wound left open   Packing materials:  1/4 in iodoform gauze Post-procedure details:    Procedure completion:  Tolerated well, no immediate complications     Medications Ordered in ED Medications  lidocaine-EPINEPHrine (XYLOCAINE W/EPI) 2 %-1:200000 (PF) injection 10 mL (10 mLs Infiltration Given by Other 12/07/23 1219)    ED Course/ Medical Decision Making/ A&P                                 Medical Decision Making Risk Prescription drug management.   Patient presents to the ED with abscess amenable to I&D based on exam. Procedure per note above. Abscess cavity did not seem large enough to warrant packing/drain placement. Given mild surrounding cellulitis will start patient on Doxycycline. Recommended application of warm compresses/soaks/flushing. Will have patient return for wound recheck in 2 days. I discussed treatment plan, need for follow-up, and return precautions with the patient. Provided opportunity for questions, patient confirmed understanding and is in agreement with plan.          Final Clinical Impression(s) / ED Diagnoses Final diagnoses:  Abscess of axilla, right    Rx / DC Orders ED Discharge Orders          Ordered    doxycycline (VIBRAMYCIN) 100 MG capsule  2 times daily        12/07/23 1241               Rosezella Rumpf, PA-C 12/07/23 1425    Elayne Snare K, DO 12/07/23 1437

## 2023-12-07 NOTE — Discharge Instructions (Signed)
You were seen in the emergency department for a skin abscess- please see the attached handout for further information regarding this diagnoses. This area was incised and drained to help release the bacteria. We would like you to apply warm compresses and warm flushes to this area 4-5 times per day to help facilitate further draining as needed. We are also starting you on doxycycline, an antibiotic, in order to help treat the infection.   We have prescribed you new medication(s) today. Discuss the medications prescribed today with your pharmacist as they can have adverse effects and interactions with your other medicines including over the counter and prescribed medications. Seek medical evaluation if you start to experience new or abnormal symptoms after taking one of these medicines, seek care immediately if you start to experience difficulty breathing, feeling of your throat closing, facial swelling, or rash as these could be indications of a more serious allergic reaction  We would like you to have this area rechecked within 48 hours- please return to the ER , go to an urgent care, or see your primary care provider for this. Return to the ER sooner for new or worsening symptoms including, but not limited to increased pain, spreading redness, fevers, inability to keep fluids down, or any other concerns that you may have.   

## 2023-12-07 NOTE — ED Triage Notes (Signed)
 Pt arrived via POV. C/o pain and swelling R axilla for 4 days. No drainage

## 2023-12-09 ENCOUNTER — Emergency Department (HOSPITAL_COMMUNITY)
Admission: EM | Admit: 2023-12-09 | Discharge: 2023-12-09 | Disposition: A | Discharge status: Home / Self Care | Payer: Self-pay | Attending: Emergency Medicine | Admitting: Emergency Medicine

## 2023-12-09 ENCOUNTER — Other Ambulatory Visit: Payer: Self-pay

## 2023-12-09 DIAGNOSIS — Z4800 Encounter for change or removal of nonsurgical wound dressing: Secondary | ICD-10-CM | POA: Insufficient documentation

## 2023-12-09 DIAGNOSIS — L02411 Cutaneous abscess of right axilla: Secondary | ICD-10-CM | POA: Insufficient documentation

## 2023-12-09 DIAGNOSIS — Z5189 Encounter for other specified aftercare: Secondary | ICD-10-CM

## 2023-12-09 NOTE — ED Triage Notes (Signed)
 Patient to ED by POV for removal of dressing. He had abscess drained a few days ago denies s/s of infection.

## 2023-12-09 NOTE — ED Provider Notes (Signed)
  EMERGENCY DEPARTMENT AT [facility] Provider Note   CSN: [id number] Arrival date & time: 12/09/23  1142     History Chief Complaint  Patient presents with   Abscess    Joshua Roach is a 28 y.o. male.  Patient presents to the emergency department today for concerns of a wound follow-up.  He reportedly incision and drainage on 12/07/2023 an abscess of the right axilla.  He reports that the areas been healing without any issues.  The wound packing still in place and is here for removal of wound packing.  He is currently on doxycycline 100 mg twice a day and has been on this without any issues.  Denies any recent increase in discharge or drainage, erythema, or any recent fevers   Abscess      Home Medications Prior to Admission medications   Medication Sig Start Date End Date Taking? Authorizing Provider  acetaminophen (TYLENOL) 325 MG tablet Take 650 mg by mouth every 6 (six) hours as needed for mild pain.    [provider]  albuterol (VENTOLIN HFA) 108 (90 Base) MCG/ACT inhaler Inhale 1-2 puffs into the lungs every 6 (six) hours as needed for wheezing or shortness of breath. 10/26/23   Keith, Kayla N, PA-C  cetirizine (ZYRTEC ALLERGY) 10 MG tablet Take 1 tablet (10 mg total) by mouth daily. 01/10/22   Darlis Eisenmenger, PA-C  doxycycline (VIBRAMYCIN) 100 MG capsule Take 1 capsule (100 mg total) by mouth 2 (two) times daily. 12/07/23   Kehrli, Kelsey F, PA-C  fluticasone (FLONASE) 50 MCG/ACT nasal spray Place 1 spray into both nostrils daily. 01/10/22   Darlis Eisenmenger, PA-C  ipratropium-albuterol (DUONEB) 0.5-2.5 (3) MG/3ML SOLN Take 3 mLs by nebulization every 6 (six) hours as needed. 10/26/23   Carie Charity, PA-C  predniSONE (STERAPRED UNI-PAK 21 TAB) 10 MG (21) TBPK tablet Take by mouth daily. Take 6 tabs by mouth daily  for 2 days, then 5 tabs for 2 days, then 4 tabs for 2 days, then 3 tabs for 2 days, 2 tabs for 2 days, then 1 tab by mouth daily for 2 days  10/26/23   Keith, Kayla N, PA-C      Allergies    Patient has no known allergies.    Review of Systems   Review of Systems  Skin:  Positive for wound.  All other systems reviewed and are negative.   Physical Exam Updated Vital Signs BP (!) 154/100 (BP Location: Left Arm)   Pulse 63   Temp 98 F (36.7 C) (Oral)   Resp 16   Ht 5\' 5"  (1.651 m)   Wt 97.5 kg   SpO2 99%   BMI 35.77 kg/m  Physical Exam Vitals and nursing note reviewed.  Constitutional:      General: He is not in acute distress.    Appearance: He is well-developed.  HENT:     Head: Normocephalic and atraumatic.  Eyes:     Conjunctiva/sclera: Conjunctivae normal.  Cardiovascular:     Rate and Rhythm: Normal rate and regular rhythm.     Heart sounds: No murmur heard. Pulmonary:     Effort: Pulmonary effort is normal. No respiratory distress.     Breath sounds: Normal breath sounds.  Abdominal:     Palpations: Abdomen is soft.     Tenderness: There is no abdominal tenderness.  Musculoskeletal:        General: No swelling.     Cervical back: Neck supple.  Skin:    General: Skin is warm and dry.     Capillary Refill: Capillary refill takes less than 2 seconds.     Findings: Lesion present.     Comments: Right axilla with linear incision present with deep pocket remaining open with wound packing. No significant skin erythema or induration present. Minimal drainage.  Neurological:     Mental Status: He is alert.  Psychiatric:        Mood and Affect: Mood normal.     ED Results / Procedures / Treatments   Labs (all labs ordered are listed, but only abnormal results are displayed) Labs Reviewed - No data to display  EKG None  Radiology No results found.  Procedures Procedures    Medications Ordered in ED Medications - No data to display  ED Course/ Medical Decision Making/ A&P                                 Medical Decision Making  This patient presents to the ED for concern of wound  check. Differential diagnosis includes skin abscess, cellulitis, lymphadenopathy   Problem List / ED Course:  Patient presents the emergency department for wound follow-up.  He reports he had incision and drainage performed about 2 days ago and started on doxycycline.  He has been packing in place and is here to have this wound packing removed if necessary. On exam, the right axilla appears to be healing well with no significant erythema or induration present.  No signs of spreading or worsening infection no new abscess appreciated.  Wound packing was removed with serosanguineous discharge present although minimal drainage seen.  Given reassuring exam, advised patient continue with his current doxycycline prescription and discussed return precautions such as development of new or worsening symptoms concerning for a worsening infection.  Patient otherwise stable for discharge home with outpatient follow-up.  Final Clinical Impression(s) / ED Diagnoses Final diagnoses:  Wound check, abscess  Abscess of right axilla    Rx / DC Orders ED Discharge Orders     None         Wesson Stith A, PA-C 12/09/23 1208    Tegeler, Marine Sia, MD 12/09/23 1549

## 2023-12-09 NOTE — Discharge Instructions (Addendum)
 You are seen in the emergency department today for a wound check.  Your abscess site appears to be healing well after the incision and drainage was performed a few days ago.  Please continue with your doxycycline prescription as written.  For any concerns of new or worsening symptoms, return to the emergency department.

## 2024-02-24 ENCOUNTER — Emergency Department (HOSPITAL_COMMUNITY)
Admission: EM | Admit: 2024-02-24 | Discharge: 2024-02-24 | Disposition: A | Discharge status: Home / Self Care | Payer: Self-pay | Attending: Emergency Medicine | Admitting: Emergency Medicine

## 2024-02-24 ENCOUNTER — Encounter (HOSPITAL_COMMUNITY): Payer: Self-pay

## 2024-02-24 DIAGNOSIS — Z7951 Long term (current) use of inhaled steroids: Secondary | ICD-10-CM | POA: Insufficient documentation

## 2024-02-24 DIAGNOSIS — Z760 Encounter for issue of repeat prescription: Secondary | ICD-10-CM | POA: Insufficient documentation

## 2024-02-24 DIAGNOSIS — J4520 Mild intermittent asthma, uncomplicated: Secondary | ICD-10-CM | POA: Insufficient documentation

## 2024-02-24 MED ORDER — ALBUTEROL SULFATE HFA 108 (90 BASE) MCG/ACT IN AERS: 1.0000 | INHALATION_SPRAY | Freq: Four times a day (QID) | RESPIRATORY_TRACT | 0 refills | Status: AC | PRN

## 2024-02-24 MED ORDER — PREDNISONE 20 MG PO TABS: 40.0000 mg | ORAL_TABLET | Freq: Every day | ORAL | 0 refills | Status: DC

## 2024-02-24 MED ORDER — ALBUTEROL SULFATE HFA 108 (90 BASE) MCG/ACT IN AERS
1.0000 | INHALATION_SPRAY | Freq: Once | RESPIRATORY_TRACT | Status: AC
  Administered 2024-02-24: 1 via RESPIRATORY_TRACT
  Filled 2024-02-24: qty 6.7

## 2024-02-24 MED ORDER — DEXAMETHASONE SODIUM PHOSPHATE 10 MG/ML IJ SOLN
10.0000 mg | Freq: Once | INTRAMUSCULAR | Status: AC
  Administered 2024-02-24: 10 mg via INTRAMUSCULAR
  Filled 2024-02-24: qty 1

## 2024-02-24 MED ORDER — ALBUTEROL SULFATE HFA 108 (90 BASE) MCG/ACT IN AERS: 1.0000 | INHALATION_SPRAY | Freq: Four times a day (QID) | RESPIRATORY_TRACT | 0 refills | Status: DC | PRN

## 2024-02-24 MED ORDER — PREDNISONE 20 MG PO TABS: 40.0000 mg | ORAL_TABLET | Freq: Every day | ORAL | 0 refills | Status: AC

## 2024-02-24 NOTE — ED Provider Notes (Signed)
  EMERGENCY DEPARTMENT AT [facility] Provider Note   CSN: [id number] Arrival date & time: 02/24/24  1314     Patient presents with: Medication Refill   Joshua Roach is a 28 y.o. male with a history of asthma presents the ED today for medication refill.  Patient reports that he got a new position at work and his insurance change.  He does not have a PCP at this time.  States that he has been having increased shortness of breath and using his DuoNeb at home.  He is out of albuterol  inhaler as requested at this time.  States that he has been using the DuoNeb treatments about once a day, with improvement.  No fevers or chest pain.  No additional complaints or concerns at this time.    Prior to Admission medications   Medication Sig Start Date End Date Taking? Authorizing Provider  acetaminophen  (TYLENOL ) 325 MG tablet Take 650 mg by mouth every 6 (six) hours as needed for mild pain.    [provider]  albuterol  (VENTOLIN  HFA) 108 (90 Base) MCG/ACT inhaler Inhale 1-2 puffs into the lungs every 6 (six) hours as needed for wheezing or shortness of breath. 10/26/23   Keith, Kayla N, PA-C  albuterol  (VENTOLIN  HFA) 108 (90 Base) MCG/ACT inhaler Inhale 1-2 puffs into the lungs every 6 (six) hours as needed for wheezing or shortness of breath. 02/24/24 03/25/24  Waddell Sluder, PA-C  cetirizine  (ZYRTEC  ALLERGY) 10 MG tablet Take 1 tablet (10 mg total) by mouth daily. 01/10/22   Beverley Leita LABOR, PA-C  doxycycline  (VIBRAMYCIN ) 100 MG capsule Take 1 capsule (100 mg total) by mouth 2 (two) times daily. 12/07/23   Kehrli, Kelsey F, PA-C  fluticasone  (FLONASE ) 50 MCG/ACT nasal spray Place 1 spray into both nostrils daily. 01/10/22   Beverley Leita LABOR, PA-C  ipratropium-albuterol  (DUONEB) 0.5-2.5 (3) MG/3ML SOLN Take 3 mLs by nebulization every 6 (six) hours as needed. 10/26/23   Keith, Kayla N, PA-C  predniSONE  (DELTASONE ) 20 MG tablet Take 2 tablets (40 mg total) by mouth daily for 4 days.  02/25/24 02/29/24  Waddell Sluder, PA-C  predniSONE  (STERAPRED UNI-PAK 21 TAB) 10 MG (21) TBPK tablet Take by mouth daily. Take 6 tabs by mouth daily  for 2 days, then 5 tabs for 2 days, then 4 tabs for 2 days, then 3 tabs for 2 days, 2 tabs for 2 days, then 1 tab by mouth daily for 2 days 10/26/23   Keith, Kayla N, PA-C    Allergies: Patient has no known allergies.    Review of Systems  Respiratory:  Positive for shortness of breath.   All other systems reviewed and are negative.   Updated Vital Signs BP (!) 146/97   Pulse 65   Temp 98.3 F (36.8 C) (Oral)   Resp 16   Ht 5' 5 (1.651 m)   Wt 97.5 kg   SpO2 94%   BMI 35.78 kg/m   Physical Exam Vitals and nursing note reviewed.  Constitutional:      General: He is not in acute distress.    Appearance: Normal appearance.  HENT:     Head: Normocephalic and atraumatic.     Mouth/Throat:     Mouth: Mucous membranes are moist.   Eyes:     Conjunctiva/sclera: Conjunctivae normal.     Pupils: Pupils are equal, round, and reactive to light.    Cardiovascular:     Rate and Rhythm: Normal rate and regular rhythm.  Pulses: Normal pulses.     Heart sounds: Normal heart sounds.  Pulmonary:     Effort: Pulmonary effort is normal.     Breath sounds: Wheezing present.     Comments: Wheezing present in upper lobes bilaterally.  94% on room air.  Able to speak in full sentences. Abdominal:     Palpations: Abdomen is soft.     Tenderness: There is no abdominal tenderness.   Musculoskeletal:        General: Normal range of motion.     Cervical back: Normal range of motion.   Skin:    General: Skin is warm and dry.     Findings: No rash.   Neurological:     General: No focal deficit present.     Mental Status: He is alert.   Psychiatric:        Mood and Affect: Mood normal.        Behavior: Behavior normal.    (all labs ordered are listed, but only abnormal results are displayed) Labs Reviewed - No data to  display  EKG: None  Radiology: No results found.   Procedures   Medications Ordered in the ED  albuterol  (VENTOLIN  HFA) 108 (90 Base) MCG/ACT inhaler 1 puff (1 puff Inhalation Provided for home use 02/24/24 1459)  dexamethasone  (DECADRON ) injection 10 mg (10 mg Intramuscular Given 02/24/24 1459)                                    Medical Decision Making Risk Prescription drug management.   This patient presents to the ED for concern of medication refill, this involves an extensive number of treatment options, and is a complaint that carries with it a high risk of complications and morbidity.    Comorbidities  See HPI above   Additional History  Additional history obtained from prior records   Problem List / ED Course / Critical Interventions / Medication Management  Patient presents the ED today for medication refill.  Reports having a DuoNeb but not having an albuterol  inhaler.  Has been using DuoNeb treatments almost daily with improvement of symptoms.  States that his asthma has increased over the past several days, he thinks it is due to the heat.  Denies any chest pain, fevers, or sick symptoms. I ordered medications including: Albuterol  inhaler and Decadron  for asthma exacerbation  Medications given prior to discharge. Declined DuoNeb treatment here.  Denies any sick symptoms, low suspicion for viral illness causing his symptoms. No cough or fevers, low suspicion for pneumonia. With shared decision making, no imaging or further work up was done today. Patient felt confident his symptoms are second to asthma and I agree. I have reviewed the patients home medicines and have made adjustments as needed   Social Determinants of Health  Access to healthcare   Test / Admission - Considered  Patient is stable and safe for discharge home. Return precautions given.    Final diagnoses:  Medication refill  Mild intermittent asthma, unspecified whether complicated     ED Discharge Orders          Ordered    predniSONE  (DELTASONE ) 20 MG tablet  Daily,   Status:  Discontinued        02/24/24 1437    predniSONE  (DELTASONE ) 20 MG tablet  Daily        02/24/24 1437    albuterol  (VENTOLIN  HFA) 108 (90 Base) MCG/ACT  inhaler  Every 6 hours PRN,   Status:  Discontinued        02/24/24 1437    albuterol  (VENTOLIN  HFA) 108 (90 Base) MCG/ACT inhaler  Every 6 hours PRN        02/24/24 1437               Waddell Sluder, PA-C 02/24/24 1527    Lenor Hollering, MD 02/25/24 [phone]

## 2024-02-24 NOTE — Discharge Instructions (Signed)
 Use your rescue inhaler every 4-6 hours as needed. Take Prednisone  40 mg for the next 4 days starting tomorrow for your intermittent shortness of breath.  Get help right away if: You seem to be worse and are not responding to medicine during an asthma attack. You are short of breath even at rest. You get short of breath when doing very little activity. You have trouble eating, drinking, or talking. You have chest pain or tightness. You have a fast heartbeat. Your lips or fingernails start to turn blue. You are light-headed or dizzy, or you faint. Your peak flow is less than 50% of your personal best. You feel too tired to breathe normally.

## 2024-02-24 NOTE — ED Triage Notes (Signed)
 Pt requesting a rescue inhaler.  Pt reports his asthma has been flaring up at work.  Sts he works in a [employer].  Pt is currently denying complaints.   Pt does not have a PCP.

## 2024-06-19 ENCOUNTER — Emergency Department (HOSPITAL_COMMUNITY): Payer: Self-pay

## 2024-06-19 ENCOUNTER — Other Ambulatory Visit: Payer: Self-pay

## 2024-06-19 ENCOUNTER — Emergency Department (HOSPITAL_COMMUNITY)
Admission: EM | Admit: 2024-06-19 | Discharge: 2024-06-19 | Disposition: A | Discharge status: Home / Self Care | Payer: Self-pay

## 2024-06-19 DIAGNOSIS — S93492A Sprain of other ligament of left ankle, initial encounter: Secondary | ICD-10-CM | POA: Insufficient documentation

## 2024-06-19 DIAGNOSIS — X500XXA Overexertion from strenuous movement or load, initial encounter: Secondary | ICD-10-CM | POA: Insufficient documentation

## 2024-06-19 MED ORDER — ALBUTEROL SULFATE HFA 108 (90 BASE) MCG/ACT IN AERS: 1.0000 | INHALATION_SPRAY | Freq: Four times a day (QID) | RESPIRATORY_TRACT | 2 refills | Status: AC | PRN

## 2024-06-19 NOTE — ED Triage Notes (Signed)
 Pt sts his foot got caught on the last step of some stairs causing injury to the top of his left foot. Presents with swelling to top of foot. No other injuries. No head injury. Ambulatory in triage.

## 2024-06-19 NOTE — ED Provider Notes (Signed)
 [place] EMERGENCY DEPARTMENT AT [facility] Provider Note   CSN: [id number] Arrival date & time: 06/19/24  2201     Patient presents with: Foot Injury (Left)   Joshua Roach is a 28 y.o. male.    Foot Injury  Presents because of foot injury.  Left ankle.  Patient states he tripped while coming steps.  Twisted his left ankle.  Did not hit his head.  Only location of pain is his left ankle.   Also out of his  medication of albuterol .  Asking for refill.    Prior to Admission medications   Medication Sig Start Date End Date Taking? Authorizing Provider  acetaminophen  (TYLENOL ) 325 MG tablet Take 650 mg by mouth every 6 (six) hours as needed for mild pain.    [provider]  albuterol  (VENTOLIN  HFA) 108 (90 Base) MCG/ACT inhaler Inhale 1-2 puffs into the lungs every 6 (six) hours as needed for wheezing or shortness of breath. 02/24/24 03/25/24  Waddell Sluder, PA-C  albuterol  (VENTOLIN  HFA) 108 (90 Base) MCG/ACT inhaler Inhale 1-2 puffs into the lungs every 6 (six) hours as needed for wheezing or shortness of breath. 06/19/24   Simon Lavonia SAILOR, MD  cetirizine  (ZYRTEC  ALLERGY) 10 MG tablet Take 1 tablet (10 mg total) by mouth daily. 01/10/22   Beverley Leita LABOR, PA-C  doxycycline  (VIBRAMYCIN ) 100 MG capsule Take 1 capsule (100 mg total) by mouth 2 (two) times daily. 12/07/23   Kehrli, Kelsey F, PA-C  fluticasone  (FLONASE ) 50 MCG/ACT nasal spray Place 1 spray into both nostrils daily. 01/10/22   Beverley Leita LABOR, PA-C  ipratropium-albuterol  (DUONEB) 0.5-2.5 (3) MG/3ML SOLN Take 3 mLs by nebulization every 6 (six) hours as needed. 10/26/23   Francis Ileana SAILOR, PA-C  predniSONE  (STERAPRED UNI-PAK 21 TAB) 10 MG (21) TBPK tablet Take by mouth daily. Take 6 tabs by mouth daily  for 2 days, then 5 tabs for 2 days, then 4 tabs for 2 days, then 3 tabs for 2 days, 2 tabs for 2 days, then 1 tab by mouth daily for 2 days 10/26/23   Keith, Kayla N, PA-C    Allergies: Patient has no known  allergies.    Review of Systems  Updated Vital Signs BP (!) 162/85   Pulse 92   Temp 99 F (37.2 C) (Oral)   Resp 18   SpO2 98%   Physical Exam  (all labs ordered are listed, but only abnormal results are displayed) Labs Reviewed - No data to display  EKG: None  Radiology: DG Foot Complete Left Result Date: 06/19/2024 CLINICAL DATA:  Foot injury EXAM: LEFT FOOT - COMPLETE 3+ VIEW COMPARISON:  None Available. FINDINGS: There is no evidence of fracture or dislocation. There is no evidence of arthropathy or other focal bone abnormality. Soft tissues are unremarkable. IMPRESSION: Negative. Electronically Signed   By: Luke Bun M.D.   On: 06/19/2024 22:25     Procedures   Medications Ordered in the ED - No data to display                                  Medical Decision Making Amount and/or Complexity of Data Reviewed Radiology: ordered.   Presents because of foot injury.  Left ankle.  Patient states he tripped while coming steps.  Twisted his left ankle.  Did not hit his head.  Only location of pain is his left ankle.  Also out of his  medication of albuterol .  Asking for refill.  Pain to palpation along the left ankle.  Negative x-ray.  No fracture.  Hematoma stable.  No vascular issues.  Will refill albuterol  inhaler.     Final diagnoses:  Sprain of anterior talofibular ligament of left ankle, initial encounter    ED Discharge Orders          Ordered    albuterol  (VENTOLIN  HFA) 108 (90 Base) MCG/ACT inhaler  Every 6 hours PRN        06/19/24 2247               Simon Lavonia SAILOR, MD 06/19/24 2247

## 2024-06-19 NOTE — ED Notes (Signed)
 Patient transported to X-ray

## 2024-06-19 NOTE — Discharge Instructions (Signed)
 For pain, you can take 1000 mg of Tylenol  or 1 g of Tylenol  every 6-8 hours.  Do not exceed more than 4000 mg or 4 g in a 24-hour period.  You can also take ibuprofen 600 to 800 mg every 6-8 hours as well.  Do not take this high-dose ibuprofen for greater than a week.

## 2024-09-01 ENCOUNTER — Other Ambulatory Visit: Payer: Self-pay

## 2024-09-01 ENCOUNTER — Emergency Department (HOSPITAL_COMMUNITY)
Admission: EM | Admit: 2024-09-01 | Discharge: 2024-09-01 | Disposition: A | Discharge status: Home / Self Care | Payer: Self-pay | Attending: Emergency Medicine | Admitting: Emergency Medicine

## 2024-09-01 ENCOUNTER — Encounter (HOSPITAL_COMMUNITY): Payer: Self-pay | Admitting: Pharmacy Technician

## 2024-09-01 DIAGNOSIS — Z760 Encounter for issue of repeat prescription: Secondary | ICD-10-CM | POA: Insufficient documentation

## 2024-09-01 DIAGNOSIS — J4540 Moderate persistent asthma, uncomplicated: Secondary | ICD-10-CM | POA: Insufficient documentation

## 2024-09-01 DIAGNOSIS — Z7951 Long term (current) use of inhaled steroids: Secondary | ICD-10-CM | POA: Insufficient documentation

## 2024-09-01 MED ORDER — IPRATROPIUM BROMIDE 0.02 % IN SOLN
0.5000 mg | Freq: Once | RESPIRATORY_TRACT | Status: AC
  Administered 2024-09-01: 0.5 mg via RESPIRATORY_TRACT
  Filled 2024-09-01: qty 2.5

## 2024-09-01 MED ORDER — ALBUTEROL SULFATE (2.5 MG/3ML) 0.083% IN NEBU: 2.5000 mg | INHALATION_SOLUTION | Freq: Four times a day (QID) | RESPIRATORY_TRACT | 5 refills | Status: AC | PRN

## 2024-09-01 MED ORDER — ALBUTEROL SULFATE (2.5 MG/3ML) 0.083% IN NEBU
5.0000 mg | INHALATION_SOLUTION | Freq: Once | RESPIRATORY_TRACT | Status: AC
  Administered 2024-09-01: 5 mg via RESPIRATORY_TRACT
  Filled 2024-09-01: qty 6

## 2024-09-01 MED ORDER — ALBUTEROL SULFATE HFA 108 (90 BASE) MCG/ACT IN AERS
2.0000 | INHALATION_SPRAY | RESPIRATORY_TRACT | Status: DC | PRN
  Administered 2024-09-01: 2 via RESPIRATORY_TRACT
  Filled 2024-09-01: qty 6.7

## 2024-09-01 NOTE — ED Provider Notes (Signed)
 " [place] EMERGENCY DEPARTMENT AT [facility] Provider Note   CSN: [id number] Arrival date & time: 09/01/24  1049     Patient presents with: Medication Refill   Joshua Roach is a 29 y.o. male.   Patient to ED with c/o wheezing. He has a history of asthma and uses nebulized and inhaled Albuterol  regularly. He is out of this medications. He reports trying to get established with primary care but unable to do so. No fever, pain, unusual SOB, vomiting.   The history is provided by the patient. No language interpreter was used.  Medication Refill      Prior to Admission medications  Medication Sig Start Date End Date Taking? Authorizing Provider  albuterol  (PROVENTIL ) (2.5 MG/3ML) 0.083% nebulizer solution Take 3 mLs (2.5 mg total) by nebulization every 6 (six) hours as needed for wheezing or shortness of breath. 09/01/24  Yes Jonna Dittrich, Margit, PA-C  acetaminophen  (TYLENOL ) 325 MG tablet Take 650 mg by mouth every 6 (six) hours as needed for mild pain.    [provider]  albuterol  (VENTOLIN  HFA) 108 (90 Base) MCG/ACT inhaler Inhale 1-2 puffs into the lungs every 6 (six) hours as needed for wheezing or shortness of breath. 02/24/24 03/25/24  Waddell Sluder, PA-C  albuterol  (VENTOLIN  HFA) 108 (90 Base) MCG/ACT inhaler Inhale 1-2 puffs into the lungs every 6 (six) hours as needed for wheezing or shortness of breath. 06/19/24   Simon Lavonia SAILOR, MD  cetirizine  (ZYRTEC  ALLERGY) 10 MG tablet Take 1 tablet (10 mg total) by mouth daily. 01/10/22   Beverley Leita LABOR, PA-C  doxycycline  (VIBRAMYCIN ) 100 MG capsule Take 1 capsule (100 mg total) by mouth 2 (two) times daily. 12/07/23   Kehrli, Kelsey F, PA-C  fluticasone  (FLONASE ) 50 MCG/ACT nasal spray Place 1 spray into both nostrils daily. 01/10/22   Beverley Leita LABOR, PA-C  ipratropium-albuterol  (DUONEB) 0.5-2.5 (3) MG/3ML SOLN Take 3 mLs by nebulization every 6 (six) hours as needed. 10/26/23   Francis Ileana SAILOR, PA-C  predniSONE  (STERAPRED  UNI-PAK 21 TAB) 10 MG (21) TBPK tablet Take by mouth daily. Take 6 tabs by mouth daily  for 2 days, then 5 tabs for 2 days, then 4 tabs for 2 days, then 3 tabs for 2 days, 2 tabs for 2 days, then 1 tab by mouth daily for 2 days 10/26/23   Keith, Kayla N, PA-C    Allergies: Patient has no known allergies.    Review of Systems  Updated Vital Signs BP (!) 144/77 (BP Location: Left Arm)   Pulse 61   Temp 98.5 F (36.9 C) (Oral)   Resp 14   Ht 5' 5 (1.651 m)   Wt 102.1 kg   SpO2 93%   BMI 37.44 kg/m   Physical Exam Constitutional:      Appearance: He is well-developed.  HENT:     Head: Normocephalic.  Cardiovascular:     Rate and Rhythm: Normal rate and regular rhythm.     Heart sounds: No murmur heard. Pulmonary:     Effort: Pulmonary effort is normal.     Breath sounds: Wheezing (Bilateral) present. No rhonchi or rales.  Abdominal:     Palpations: Abdomen is soft.     Tenderness: There is no abdominal tenderness. There is no guarding or rebound.  Musculoskeletal:        General: Normal range of motion.     Cervical back: Normal range of motion and neck supple.  Skin:    General:  Skin is warm and dry.  Neurological:     General: No focal deficit present.     Mental Status: He is alert and oriented to person, place, and time.     (all labs ordered are listed, but only abnormal results are displayed) Labs Reviewed - No data to display  EKG: None  Radiology: No results found.   Procedures   Medications Ordered in the ED  albuterol  (VENTOLIN  HFA) 108 (90 Base) MCG/ACT inhaler 2 puff (2 puffs Inhalation Provided for home use 09/01/24 1459)  albuterol  (PROVENTIL ) (2.5 MG/3ML) 0.083% nebulizer solution 5 mg (5 mg Nebulization Given 09/01/24 1459)  ipratropium (ATROVENT ) nebulizer solution 0.5 mg (0.5 mg Nebulization Given 09/01/24 1500)    Clinical Course as of 09/01/24 1613  Sun Sep 01, 2024  1611 Patient to ED requesting refills of Albuterol  for neb and by inhaler. No  fever. No unusual wheezing. On exam, he has bilateral mild wheezing. Nebulizer provided with Albuterol  and Atrovent . Will provide Rx for same, inhaler provided in ED. Encouraged him to continue efforts to establish with primary care provider. Stable for discharge. No unaddressed concerns. [SU]    Clinical Course User Index [SU] Odell Balls, PA-C                                 Medical Decision Making Risk Prescription drug management.        Final diagnoses:  Moderate persistent asthma without complication    ED Discharge Orders          Ordered    albuterol  (PROVENTIL ) (2.5 MG/3ML) 0.083% nebulizer solution  Every 6 hours PRN        09/01/24 1522               Odell Balls, PA-C 09/01/24 1613    Kingsley, Victoria K, DO 09/01/24 1944  "

## 2024-09-01 NOTE — ED Triage Notes (Signed)
 Pt here with need for refill for inhaler and nebulizer.

## 2024-09-01 NOTE — Discharge Instructions (Signed)
 Continue efforts to establish with a primary care provider for regular treatment of asthma. Return to the ED as needed.

## 2024-09-15 ENCOUNTER — Emergency Department (HOSPITAL_COMMUNITY)
Admission: EM | Admit: 2024-09-15 | Discharge: 2024-09-15 | Disposition: A | Discharge status: Home / Self Care | Payer: Self-pay | Attending: Emergency Medicine | Admitting: Emergency Medicine

## 2024-09-15 ENCOUNTER — Other Ambulatory Visit: Payer: Self-pay

## 2024-09-15 DIAGNOSIS — J45909 Unspecified asthma, uncomplicated: Secondary | ICD-10-CM | POA: Insufficient documentation

## 2024-09-15 DIAGNOSIS — Z760 Encounter for issue of repeat prescription: Secondary | ICD-10-CM | POA: Insufficient documentation

## 2024-09-15 MED ORDER — ALBUTEROL SULFATE HFA 108 (90 BASE) MCG/ACT IN AERS
1.0000 | INHALATION_SPRAY | Freq: Once | RESPIRATORY_TRACT | Status: AC
  Administered 2024-09-15: 1 via RESPIRATORY_TRACT
  Filled 2024-09-15: qty 6.7

## 2024-09-15 NOTE — ED Provider Notes (Signed)
 " [place] EMERGENCY DEPARTMENT AT [facility] Provider Note   CSN: [id number] Arrival date & time: 09/15/24  1409     History Chief Complaint  Patient presents with   Medication Refill    HPI: Joshua Roach is a 29 y.o. male with history pertinent asthma who presents complaining of need for albuterol  inhaler refill. Patient arrived via POV.  History provided by patient.  No interpreter required during this encounter.  Patient reports that he has a history of asthma, takes albuterol  inhaler for this.  Reports that he ran out of his albuterol  inhaler.  Recently got a new job and is trying to get established with a new primary care doctor, however would like to have an albuterol  inhaler available in case he needs it before then.  Denies any current fevers, chills, chest pain, shortness of breath, wheezing, denies any acute complaint right now, here primarily for medication refill in case he needs this before he is able to get a refill from his new PCP.  Patient's recorded medical, surgical, social, medication list and allergies were reviewed in the Snapshot window as part of the initial history.   Prior to Admission medications  Medication Sig Start Date End Date Taking? Authorizing Provider  acetaminophen  (TYLENOL ) 325 MG tablet Take 650 mg by mouth every 6 (six) hours as needed for mild pain.    [provider]  albuterol  (PROVENTIL ) (2.5 MG/3ML) 0.083% nebulizer solution Take 3 mLs (2.5 mg total) by nebulization every 6 (six) hours as needed for wheezing or shortness of breath. 09/01/24   Odell Balls, PA-C  albuterol  (VENTOLIN  HFA) 108 (90 Base) MCG/ACT inhaler Inhale 1-2 puffs into the lungs every 6 (six) hours as needed for wheezing or shortness of breath. 02/24/24 03/25/24  Waddell Sluder, PA-C  albuterol  (VENTOLIN  HFA) 108 (90 Base) MCG/ACT inhaler Inhale 1-2 puffs into the lungs every 6 (six) hours as needed for wheezing or shortness of breath. 06/19/24   Simon Lavonia SAILOR, MD  cetirizine  (ZYRTEC  ALLERGY) 10 MG tablet Take 1 tablet (10 mg total) by mouth daily. 01/10/22   Beverley Leita LABOR, PA-C  doxycycline  (VIBRAMYCIN ) 100 MG capsule Take 1 capsule (100 mg total) by mouth 2 (two) times daily. 12/07/23   Kehrli, Kelsey F, PA-C  fluticasone  (FLONASE ) 50 MCG/ACT nasal spray Place 1 spray into both nostrils daily. 01/10/22   Beverley Leita LABOR, PA-C  ipratropium-albuterol  (DUONEB) 0.5-2.5 (3) MG/3ML SOLN Take 3 mLs by nebulization every 6 (six) hours as needed. 10/26/23   Francis Ileana SAILOR, PA-C  predniSONE  (STERAPRED UNI-PAK 21 TAB) 10 MG (21) TBPK tablet Take by mouth daily. Take 6 tabs by mouth daily  for 2 days, then 5 tabs for 2 days, then 4 tabs for 2 days, then 3 tabs for 2 days, 2 tabs for 2 days, then 1 tab by mouth daily for 2 days 10/26/23   Keith, Kayla N, PA-C     Allergies: Patient has no known allergies.   Review of Systems   ROS as per HPI  Physical Exam Updated Vital Signs BP (!) 167/86 (BP Location: Right Arm)   Pulse 75   Temp 98 F (36.7 C) (Oral)   Resp 16   SpO2 98%  Physical Exam Vitals and nursing note reviewed.  Constitutional:      General: He is not in acute distress.    Appearance: He is well-developed.  HENT:     Head: Normocephalic and atraumatic.  Eyes:  Conjunctiva/sclera: Conjunctivae normal.  Cardiovascular:     Rate and Rhythm: Normal rate and regular rhythm.     Heart sounds: No murmur heard. Pulmonary:     Effort: Pulmonary effort is normal. No respiratory distress.     Breath sounds: Normal breath sounds. No wheezing.  Musculoskeletal:        General: No swelling.     Cervical back: Neck supple.  Skin:    General: Skin is warm and dry.     Capillary Refill: Capillary refill takes less than 2 seconds.  Neurological:     Mental Status: He is alert.  Psychiatric:        Mood and Affect: Mood normal.     ED Course/ Medical Decision Making/ A&P    Procedures Procedures   Medications Ordered in  ED Medications  albuterol  (VENTOLIN  HFA) 108 (90 Base) MCG/ACT inhaler 1 puff (has no administration in time range)    Medical Decision Making:   Joshua Roach is a 29 y.o. male who presents for medication refill as per above.  Physical exam is pertinent for no acute abnormalities.   The differential includes but is not limited to asthma, asthma exacerbation, medication refill, hypoxic respiratory failure.  Independent historian: None  External data reviewed: No pertinent external data  Labs: Not indicated  Radiology: Not indicated No results found.  EKG/Medicine tests: Not indicated EKG Interpretation:                  Interventions: Albuterol  puffs  See the EMR for full details regarding lab and imaging results.  Patient well-appearing on exam, does not have any acute complaints, is not in respiratory distress, vitally stable, no wheezing on exam.  Do feel that albuterol  inhaler refill is very reasonable given patient's history of asthma, therefore given a puff of albuterol  in the ED and allowed to take the inhaler with him.  Encourage patient to continue follow-up outpatient with PCP.  Patient amenable to this plan, discharged in stable condition.  Presentation is most consistent with acute uncomplicated illness  Discussion of management or test interpretations with external provider(s): Not indicated  Risk Drugs:Prescription drug management  Disposition: DISCHARGE: I believe that the patient is safe for discharge home with outpatient follow-up. Patient was informed of all pertinent physical exam, laboratory, and imaging findings. Patient's suspected etiology of their symptom presentation was discussed with the patient and all questions were answered. We discussed following up with PCP. I provided thorough ED return precautions. The patient feels safe and comfortable with this plan.  MDM generated using voice dictation software and may contain dictation errors.  Please  contact me for any clarification or with any questions.  Clinical Impression:  1. Medication refill      Discharge   Final Clinical Impression(s) / ED Diagnoses Final diagnoses:  Medication refill    Rx / DC Orders ED Discharge Orders     None        Rogelia Jerilynn RAMAN, MD 09/15/24 1425  "

## 2024-09-15 NOTE — ED Triage Notes (Signed)
 Pt here with reports of needing albuterol  inhaler refilled.

## 2024-09-15 NOTE — Discharge Instructions (Signed)
 Joshua Roach  Thank you for allowing us  to take care of you today.  You came to the Emergency Department today because you are out of your albuterol  inhaler and needed a refill, you do not have any other concerns or complaints, you do not have any wheezes on exam.  We gave you a puff of an albuterol  inhaler and you can take the albuterol  inhaler with you to go.  Please follow-up with your primary care doctor for further refills.   To-Do: 1. Please follow-up with your primary doctor within 1 - 2 weeks / as soon as possible.    Please return to the Emergency Department or call 911 if you experience have worsening of your symptoms, if you develop chest pain, shortness of breath, severe or significantly worsening pain, high fever, severe confusion, pass out or have any reason to think that you need emergency medical care.   We hope you feel better soon.   [name], MD Department of Emergency Medicine [facility]

## 2024-09-15 NOTE — ED Notes (Signed)
 Patient Alert and oriented to baseline. Stable and ambulatory to baseline. Patient verbalized understanding of the discharge instructions.  Patient belongings were taken by the patient.

## 2024-09-24 ENCOUNTER — Other Ambulatory Visit: Payer: Self-pay

## 2024-09-24 ENCOUNTER — Emergency Department (HOSPITAL_COMMUNITY)
Admission: EM | Admit: 2024-09-24 | Discharge: 2024-09-24 | Disposition: A | Discharge status: Home / Self Care | Payer: Self-pay | Attending: Emergency Medicine | Admitting: Emergency Medicine

## 2024-09-24 DIAGNOSIS — J029 Acute pharyngitis, unspecified: Secondary | ICD-10-CM | POA: Insufficient documentation

## 2024-09-24 DIAGNOSIS — Z79899 Other long term (current) drug therapy: Secondary | ICD-10-CM | POA: Insufficient documentation

## 2024-09-24 DIAGNOSIS — J45909 Unspecified asthma, uncomplicated: Secondary | ICD-10-CM | POA: Insufficient documentation

## 2024-09-24 LAB — GROUP A STREP BY PCR: Group A Strep by PCR: NOT DETECTED

## 2024-09-24 MED ORDER — LIDOCAINE VISCOUS HCL 2 % MT SOLN
15.0000 mL | Freq: Once | OROMUCOSAL | Status: AC
  Administered 2024-09-24: 15 mL via OROMUCOSAL
  Filled 2024-09-24: qty 15

## 2024-09-24 MED ORDER — LIDOCAINE VISCOUS HCL 2 % MT SOLN: 15.0000 mL | OROMUCOSAL | 0 refills | Status: AC | PRN

## 2024-09-24 NOTE — ED Triage Notes (Signed)
 Pt reports sore throat for a week and now having crusty eyes. Denies fevers.

## 2024-09-24 NOTE — Discharge Instructions (Addendum)
 You were seen today for what appears to be a viral pharyngitis.  Your tonsils are significantly swollen, suspicious for possible mononucleosis or some other viral cause.  Recommend you get tested in the next week or 2 at the health department or primary care if you wish to get tested for mono.   I have sent in some lidocaine  that you can use as needed with meals.  Additionally continue to use Tylenol  and ibuprofen for discomfort.  Take Tylenol  (acetominophen)  650mg  every 4-6 hours, as needed for pain or fever. Do not take more than 4,000 mg in a 24-hour period. As this may cause liver damage. While this is rare, if you begin to develop yellowing of the skin or eyes, stop taking and return to ER immediately.  Take Ibuprofen 400mg  every 4-6 hours for pain or fever, not exceeding 3,200 mg per day as more than 3,200mg  can cause Stomach irritation, dizziness, kidney issues with long-term use.  Return to the ER sooner for evaluation if you begin to develop any sort of fever, body aches chills, choking when swallowing, uncontrollable pain.

## 2024-09-27 LAB — CULTURE, GROUP A STREP (THRC)
